# Patient Record
Sex: Male | Born: 1940
Health system: Southern US, Community
[De-identification: ages and names within clinical notes are randomized; demographics above are authoritative.]

## PROBLEM LIST (undated history)

## (undated) DIAGNOSIS — E785 Hyperlipidemia, unspecified: Secondary | ICD-10-CM

## (undated) DIAGNOSIS — I639 Cerebral infarction, unspecified: Secondary | ICD-10-CM

## (undated) DIAGNOSIS — I10 Essential (primary) hypertension: Secondary | ICD-10-CM

## (undated) DIAGNOSIS — R51 Headache: Secondary | ICD-10-CM

## (undated) DIAGNOSIS — R519 Headache, unspecified: Secondary | ICD-10-CM

## (undated) HISTORY — DX: Hyperlipidemia, unspecified: E78.5

## (undated) HISTORY — DX: Essential (primary) hypertension: I10

## (undated) HISTORY — DX: Headache, unspecified: R51.9

## (undated) HISTORY — DX: Cerebral infarction, unspecified: I63.9

## (undated) HISTORY — DX: Headache: R51

---

## 2017-07-28 ENCOUNTER — Other Ambulatory Visit: Payer: Self-pay | Admitting: Family Medicine

## 2017-07-28 ENCOUNTER — Encounter: Payer: Self-pay | Admitting: Family Medicine

## 2017-07-28 ENCOUNTER — Ambulatory Visit (INDEPENDENT_AMBULATORY_CARE_PROVIDER_SITE_OTHER): Payer: Medicare Other | Admitting: Family Medicine

## 2017-07-28 VITALS — BP 130/80 | HR 67 | Ht 68.0 in | Wt 176.0 lb

## 2017-07-28 DIAGNOSIS — Z131 Encounter for screening for diabetes mellitus: Secondary | ICD-10-CM | POA: Insufficient documentation

## 2017-07-28 DIAGNOSIS — Z91199 Patient's noncompliance with other medical treatment and regimen due to unspecified reason: Secondary | ICD-10-CM

## 2017-07-28 DIAGNOSIS — E7800 Pure hypercholesterolemia, unspecified: Secondary | ICD-10-CM

## 2017-07-28 DIAGNOSIS — I10 Essential (primary) hypertension: Secondary | ICD-10-CM | POA: Diagnosis not present

## 2017-07-28 DIAGNOSIS — F419 Anxiety disorder, unspecified: Secondary | ICD-10-CM

## 2017-07-28 DIAGNOSIS — Z9119 Patient's noncompliance with other medical treatment and regimen: Secondary | ICD-10-CM

## 2017-07-28 DIAGNOSIS — Z23 Encounter for immunization: Secondary | ICD-10-CM

## 2017-07-28 DIAGNOSIS — G44229 Chronic tension-type headache, not intractable: Secondary | ICD-10-CM

## 2017-07-28 DIAGNOSIS — E789 Disorder of lipoprotein metabolism, unspecified: Secondary | ICD-10-CM

## 2017-07-28 DIAGNOSIS — D352 Benign neoplasm of pituitary gland: Secondary | ICD-10-CM | POA: Diagnosis not present

## 2017-07-28 DIAGNOSIS — R51 Headache: Secondary | ICD-10-CM

## 2017-07-28 DIAGNOSIS — R519 Headache, unspecified: Secondary | ICD-10-CM | POA: Insufficient documentation

## 2017-07-28 DIAGNOSIS — Z1211 Encounter for screening for malignant neoplasm of colon: Secondary | ICD-10-CM

## 2017-07-28 MED ORDER — PAROXETINE HCL 10 MG PO TABS: 10.0000 mg | ORAL_TABLET | Freq: Every day | ORAL | 0 refills | Status: DC

## 2017-07-28 MED ORDER — PAROXETINE HCL 10 MG PO TABS: ORAL_TABLET | ORAL | 0 refills | Status: DC

## 2017-07-28 NOTE — Telephone Encounter (Signed)
Only meant for him to have a 30 day supply. I corrected this and sent a new rx to pharmacy.

## 2017-07-28 NOTE — Patient Instructions (Addendum)
Paroxetine tablets What is this medicine? PAROXETINE (pa ROX e teen) is used to treat depression. It may also be used to treat anxiety disorders, obsessive compulsive disorder, panic attacks, post traumatic stress, and premenstrual dysphoric disorder (PMDD). This medicine may be used for other purposes; ask your health care provider or pharmacist if you have questions. COMMON BRAND NAME(S): Paxil, Pexeva What should I tell my health care provider before I take this medicine? They need to know if you have any of these conditions: -bipolar disorder or a family history of bipolar disorder -bleeding disorders -glaucoma -heart disease -kidney disease -liver disease -low levels of sodium in the blood -seizures -suicidal thoughts, plans, or attempt; a previous suicide attempt by you or a family member -take MAOIs like Carbex, Eldepryl, Marplan, Nardil, and Parnate -take medicines that treat or prevent blood clots -thyroid disease -an unusual or allergic reaction to paroxetine, other medicines, foods, dyes, or preservatives -pregnant or trying to get pregnant -breast-feeding How should I use this medicine? Take this medicine by mouth with a glass of water. Follow the directions on the prescription label. You can take it with or without food. Take your medicine at regular intervals. Do not take your medicine more often than directed. Do not stop taking this medicine suddenly except upon the advice of your doctor. Stopping this medicine too quickly may cause serious side effects or your condition may worsen. A special MedGuide will be given to you by the pharmacist with each prescription and refill. Be sure to read this information carefully each time. Talk to your pediatrician regarding the use of this medicine in children. Special care may be needed. Overdosage: If you think you have taken too much of this medicine contact a poison control center or emergency room at once. NOTE: This medicine is  only for you. Do not share this medicine with others. What if I miss a dose? If you miss a dose, take it as soon as you can. If it is almost time for your next dose, take only that dose. Do not take double or extra doses. What may interact with this medicine? Do not take this medicine with any of the following medications: -linezolid -MAOIs like Carbex, Eldepryl, Marplan, Nardil, and Parnate -methylene blue (injected into a vein) -pimozide -thioridazine This medicine may also interact with the following medications: -alcohol -amphetamines -aspirin and aspirin-like medicines -atomoxetine -certain medicines for depression, anxiety, or psychotic disturbances -certain medicines for irregular heart beat like propafenone, flecainide, encainide, and quinidine -certain medicines for migraine headache like almotriptan, eletriptan, frovatriptan, naratriptan, rizatriptan, sumatriptan, zolmitriptan -cimetidine -digoxin -diuretics -fentanyl -fosamprenavir -furazolidone -isoniazid -lithium -medicines that treat or prevent blood clots like warfarin, enoxaparin, and dalteparin -medicines for sleep -NSAIDs, medicines for pain and inflammation, like ibuprofen or naproxen -phenobarbital -phenytoin -procarbazine -rasagiline -ritonavir -supplements like St. John's wort, kava kava, valerian -tamoxifen -tramadol -tryptophan This list may not describe all possible interactions. Give your health care provider a list of all the medicines, herbs, non-prescription drugs, or dietary supplements you use. Also tell them if you smoke, drink alcohol, or use illegal drugs. Some items may interact with your medicine. What should I watch for while using this medicine? Tell your doctor if your symptoms do not get better or if they get worse. Visit your doctor or health care professional for regular checks on your progress. Because it may take several weeks to see the full effects of this medicine, it is important  to continue your treatment as prescribed by your doctor.  Patients and their families should watch out for new or worsening thoughts of suicide or depression. Also watch out for sudden changes in feelings such as feeling anxious, agitated, panicky, irritable, hostile, aggressive, impulsive, severely restless, overly excited and hyperactive, or not being able to sleep. If this happens, especially at the beginning of treatment or after a change in dose, call your health care professional. You may get drowsy or dizzy. Do not drive, use machinery, or do anything that needs mental alertness until you know how this medicine affects you. Do not stand or sit up quickly, especially if you are an older patient. This reduces the risk of dizzy or fainting spells. Alcohol may interfere with the effect of this medicine. Avoid alcoholic drinks. Your mouth may get dry. Chewing sugarless gum or sucking hard candy, and drinking plenty of water will help. Contact your doctor if the problem does not go away or is severe. What side effects may I notice from receiving this medicine? Side effects that you should report to your doctor or health care professional as soon as possible: -allergic reactions like skin rash, itching or hives, swelling of the face, lips, or tongue -anxious -black, tarry stools -changes in vision -confusion -elevated mood, decreased need for sleep, racing thoughts, impulsive behavior -eye pain -fast, irregular heartbeat -feeling faint or lightheaded, falls -feeling agitated, angry, or irritable -hallucination, loss of contact with reality -loss of balance or coordination -loss of memory -painful or prolonged erections -restlessness, pacing, inability to keep still -seizures -stiff muscles -suicidal thoughts or other mood changes -trouble sleeping -unusual bleeding or bruising -unusually weak or tired -vomiting Side effects that usually do not require medical attention (report to your  doctor or health care professional if they continue or are bothersome): -change in appetite or weight -change in sex drive or performance -diarrhea -dizziness -dry mouth -increased sweating -indigestion, nausea -tired -tremors This list may not describe all possible side effects. Call your doctor for medical advice about side effects. You may report side effects to FDA at 1-800-FDA-1088. Where should I keep my medicine? Keep out of the reach of children. Store at room temperature between 15 and 30 degrees C (59 and 86 degrees F). Keep container tightly closed. Throw away any unused medicine after the expiration date. NOTE: This sheet is a summary. It may not cover all possible information. If you have questions about this medicine, talk to your doctor, pharmacist, or health care provider.  2018 Elsevier/Gold Standard (2015-11-25 15:50:32)  

## 2017-07-28 NOTE — Addendum Note (Signed)
Addended by: Jon Billings on: 07/28/2017 12:56 PM   Modules accepted: Orders

## 2017-07-28 NOTE — Progress Notes (Addendum)
Subjective:  Patient ID: Jose Vaughn, male    DOB: 1940/11/05  Age: 77 y.o. MRN: [id number]  CC: Establish Care   HPI Jose Vaughn presents for patient presents today with his wife for follow-up of his hypertension, elevated cholesterol, ASCVD, chronic headache, pituitary adenoma, nervousness, dizziness, establishment of care.  Patient patient reports that he is felt dizzy this.   he denies the sensation of movement.  He says that sometimes he feels lightheaded.  He has a long-standing history of generalized headache.  He says that these headaches tend to come and go on is on their own.  Headache has been stable and is nonprogressive.  He is compliant with his blood pressure medicines.  He has a long-standing history of what is believed to be of a stable pituitary macroadenoma.  MRI this past November confirm this.  He has a long-standing history of noncompliance with his medical therapy but he has wife assured me that  he has been taking his medicines.  Patient's wife said that he she thought that he had a colonoscopy 2 or 3 years ago and she is not certain what the follow-up was recommended. Last colonoscopy noted in the chart was back in 2010. He has had no changes inhis stooling patterns or rectal bleeding.   Outpatient Medications Prior to Visit  Medication Sig Dispense Refill  . amLODipine (NORVASC) 5 MG tablet Take 1 tablet by mouth daily.  4  . atorvastatin (LIPITOR) 80 MG tablet Take 1 tablet by mouth daily.  4  . metoprolol tartrate (LOPRESSOR) 25 MG tablet Take 1 tablet by mouth daily.  5  . Vitamin D, Ergocalciferol, (DRISDOL) 50000 units CAPS capsule Take 1 capsule by mouth once a week.  5   No facility-administered medications prior to visit.     ROS Review of Systems  Constitutional: Negative.   HENT: Negative.   Eyes: Negative.   Respiratory: Negative.   Cardiovascular: Negative.   Gastrointestinal: Negative.  Negative for abdominal distention, abdominal pain, anal  bleeding, blood in stool, constipation, diarrhea, nausea and vomiting.  Skin: Negative.   Neurological: Positive for light-headedness and headaches. Negative for dizziness, syncope, facial asymmetry, speech difficulty and weakness.  Hematological: Negative.   Psychiatric/Behavioral: Negative for dysphoric mood, self-injury, sleep disturbance and suicidal ideas. The patient is nervous/anxious.     Objective:  BP 130/80 (BP Location: Right Arm, Patient Position: Sitting, Cuff Size: Normal)   Pulse 67   Ht 5\' 8"  (1.727 m)   Wt 176 lb (79.8 kg)   SpO2 99%   BMI 26.76 kg/m   BP Readings from Last 3 Encounters:  07/28/17 130/80    Wt Readings from Last 3 Encounters:  07/28/17 176 lb (79.8 kg)   Depression screen PHQ 2/9 07/28/2017  Decreased Interest 2  Down, Depressed, Hopeless 0  PHQ - 2 Score 2  Altered sleeping 0  Tired, decreased energy 2  Change in appetite 0  Feeling bad or failure about yourself  0  Trouble concentrating 0  Moving slowly or fidgety/restless 0  Suicidal thoughts 0  PHQ-9 Score 4    Physical Exam  Constitutional: He is oriented to person, place, and time. He appears well-developed and well-nourished. No distress.  HENT:  Head: Normocephalic and atraumatic.  Right Ear: External ear normal.  Left Ear: External ear normal.  Mouth/Throat: No oropharyngeal exudate.  Eyes: Conjunctivae and EOM are normal. Pupils are equal, round, and reactive to light. Right eye exhibits no discharge. Left eye exhibits no  discharge. No scleral icterus.  Neck: Normal range of motion. Neck supple. No JVD present. No tracheal deviation present. No thyromegaly present.  Cardiovascular: Normal rate, regular rhythm and normal heart sounds.  Pulmonary/Chest: Effort normal and breath sounds normal. No stridor.  Abdominal: Bowel sounds are normal. He exhibits no distension. There is no tenderness. There is no rebound and no guarding.  Lymphadenopathy:    He has no cervical  adenopathy.  Neurological: He is alert and oriented to person, place, and time.  Skin: Skin is warm and dry. He is not diaphoretic.  Psychiatric: He has a normal mood and affect. His behavior is normal.    No results found for: WBC, HGB, HCT, PLT, GLUCOSE, CHOL, TRIG, HDL, LDLDIRECT, LDLCALC, ALT, AST, NA, K, CL, CREATININE, BUN, CO2, TSH, PSA, INR, GLUF, HGBA1C, MICROALBUR  Patient was never admitted.  Assessment & Plan:   Jose Vaughn was seen today for establish care.  Diagnoses and all orders for this visit:  Essential hypertension  Need for influenza vaccination -     Flu vaccine HIGH DOSE PF  Elevated serum cholesterol  Pituitary adenoma ([place]) -     Ambulatory referral to Neurology  Anxiety -     Discontinue: PARoxetine (PAXIL) 10 MG tablet; nightly -     PARoxetine (PAXIL) 10 MG tablet; Take 1 tablet (10 mg total) by mouth daily. At night  History of noncompliance with medical treatment  Screen for colon cancer -     Ambulatory referral to Gastroenterology  Chronic tension-type headache, not intractable -     Ambulatory referral to Neurology   I have discontinued Jose Vaughn's PARoxetine. I am also having him start on PARoxetine. Additionally, I am having him maintain his amLODipine, atorvastatin, Vitamin D (Ergocalciferol), and metoprolol tartrate.  I am requesting that neurology continue to follow him for his headache.  His wife says that he has medicine at the present time and chart review shows that he was just seen back in November.  Hopefully he is electing to take his cholesterol at this time and we will see improvement in his LDL cholesterol at the next visit.  Meds ordered this encounter  Medications  . DISCONTD: PARoxetine (PAXIL) 10 MG tablet    Sig: nightly    Dispense:  30 tablet    Refill:  0  . PARoxetine (PAXIL) 10 MG tablet    Sig: Take 1 tablet (10 mg total) by mouth daily. At night    Dispense:  30 tablet    Refill:  0     Follow-up: Return in  about 1 month (around 08/28/2017).  [name], MD

## 2017-07-31 ENCOUNTER — Telehealth: Payer: Self-pay | Admitting: Family Medicine

## 2017-07-31 NOTE — Telephone Encounter (Signed)
Copied from [id number]. Topic: Quick Communication - See Telephone Encounter >> Jul 31, 2017  1:33 PM Hewitt Shorts wrote: CRM for notification. See Telephone encounter for: pt is wanting to let Dr. Ethelene Hal know that his appt to the neurologist is not until 09/23/17 and he believes that Ethelene Hal wanted him to be seen before coming back to see Ethelene Hal and wants to know what to do next   Best number [phone]  07/31/17.

## 2017-07-31 NOTE — Telephone Encounter (Signed)
This will be the earliest patient can be seen. [facility] has a longer wait than March.

## 2017-07-31 NOTE — Telephone Encounter (Signed)
In this particular case, he can wait until March.

## 2017-08-01 NOTE — Telephone Encounter (Signed)
I tried to contact patient, voicemail is full. Per Dr. Ethelene Hal, it is okay for patient to wait until March for Neurology appt.

## 2017-08-04 NOTE — Telephone Encounter (Signed)
I tried to contact patient, the mailbox is full.

## 2017-08-05 NOTE — Telephone Encounter (Signed)
I called and spoke with patient. He will keep his appointment with Neurology in March.

## 2017-09-01 ENCOUNTER — Encounter: Payer: Self-pay | Admitting: Family Medicine

## 2017-09-23 ENCOUNTER — Encounter (INDEPENDENT_AMBULATORY_CARE_PROVIDER_SITE_OTHER): Payer: Self-pay

## 2017-09-23 ENCOUNTER — Encounter: Payer: Self-pay | Admitting: Neurology

## 2017-09-23 ENCOUNTER — Ambulatory Visit: Payer: Medicare Other | Admitting: Neurology

## 2017-09-23 VITALS — BP 142/78 | HR 64 | Ht 68.0 in | Wt 175.0 lb

## 2017-09-23 DIAGNOSIS — D352 Benign neoplasm of pituitary gland: Secondary | ICD-10-CM

## 2017-09-23 DIAGNOSIS — R519 Headache, unspecified: Secondary | ICD-10-CM

## 2017-09-23 DIAGNOSIS — R51 Headache: Secondary | ICD-10-CM | POA: Diagnosis not present

## 2017-09-23 MED ORDER — TRAMADOL HCL 50 MG PO TABS: 50.0000 mg | ORAL_TABLET | Freq: Four times a day (QID) | ORAL | 1 refills | Status: DC | PRN

## 2017-09-23 NOTE — Progress Notes (Signed)
PATIENT: Jose Vaughn DOB: [birth year]  Chief Complaint  Patient presents with  . Headache    He is here with his wife, Arbie Cookey.  Reports daily headaches for six months.  He takes Aleve 2-3 times per week for his more severe pain but it is only helpful some of the time.  He has previously been diagnosed with a pituitary adenoma.  [name] PCP    [name], MD     HISTORICAL  Jose Vaughn is a 77 year old male, seen in refer by his primary care physician Dr. Ethelene Hal, Mort[facility] Fries, for evaluation of frequent headaches, he is accompanied by his wife Arbie Cookey, initial evaluation was on September 23, 2017.  I reviewed his emergency room presentation on May 30, 2017, he was treated at the [facility] for worsening headaches, and confusion,  Had a repeat MRI of the brain with and without contrast in November 2018, chronic rounded intrasellar mass with suprasellar extension, compatible with pituitary macroadenoma measuring up to 22 mm, about 2 mm larger than in October 2017, regional mass-effect has not significantly change, the undersurface of the optic asthma, signal within the mass remains homogeneous, no evidence of interval hemorrhage, patchy left greater than right cerebral white matter T2/FLAIR hyperintensity lesions,  CT angiogram of the brain showed coronary atherosclerosis, CT angiogram of the neck showed fibrofatty plaque, with mild to moderate stenosis, right side is 30%, left side is 55%, vertebral artery was antegrade flow,  EEG was normal in Nov 2018.  Laboratory evaluations in March 2018, low potassium 3.6, otherwise normal CMP, CBC, hemoglobin of 12.9, ESR of 30, A1c of 5.8, C-reactive protein was elevated at 35.5, TSH 1.75  Around 2011, he presented with frequent headaches, there was also reported TIA episode, slurred speech, transient right, sometimes left side weakness, MRI of the brain at that time demonstrated pituitary macroadenoma, over the years, he was followed  by neurosurgeon Dr. Tonia Ghent and ophthalmologist Dr. Bing Plume, most recent neurosurgical evaluation was in December 2017, at that visit, there was mention of frequent headaches, interval enlargement, and upward displacement of optic chiasma, there was also elevated prolactin level 30.6 in 2017, but the patient was not treated with any medications, is worsening headache is due to cervicogenic,   Patient supposed to have his regular neurosurgeon and ophthalmology evaluation, but neurosurgeon left to the practice, he has lost follow-up for about 1 year,  Since summer 2018, he noticed worsening headaches, often starting from upper back, spreading forward to constant moderate to severe daily pressure headache, with mild blurry vision, but he denies significant visual field change, no nausea or vomiting, no lateralized motor or sensory deficit.  He has tried frequent Tylenol, NSAIDs with limited help  REVIEW OF SYSTEMS: Full 14 system review of systems performed and notable only for decreased energy, headache, dizziness, snoring, ringing the ears, weight loss  ALLERGIES: No Known Allergies  HOME MEDICATIONS: Current Outpatient Medications  Medication Sig Dispense Refill  . amLODipine (NORVASC) 5 MG tablet Take 1 tablet by mouth daily.  4  . atorvastatin (LIPITOR) 80 MG tablet Take 1 tablet by mouth daily.  4  . metoprolol tartrate (LOPRESSOR) 25 MG tablet Take 1 tablet by mouth daily.  5  . PARoxetine (PAXIL) 10 MG tablet Take 1 tablet (10 mg total) by mouth daily. At night 30 tablet 0  . Vitamin D, Ergocalciferol, (DRISDOL) 50000 units CAPS capsule Take 1 capsule by mouth once a week.  5   No current facility-administered medications for this  visit.     PAST MEDICAL HISTORY: Past Medical History:  Diagnosis Date  . Frequent headaches   . Hyperlipidemia   . Hypertension   . Stroke [facility])     PAST SURGICAL HISTORY: History reviewed. No pertinent surgical history.  FAMILY HISTORY: Family  History  Problem Relation Age of Onset  . Heart attack Mother   . Other Father        unsure  . Ovarian cancer Sister     SOCIAL HISTORY:  Social History   Socioeconomic History  . Marital status: Married    Spouse name: Not on file  . Number of children: 3  . Years of education: 10th grade  . Highest education level: Not on file  Social Needs  . Financial resource strain: Not on file  . Food insecurity - worry: Not on file  . Food insecurity - inability: Not on file  . Transportation needs - medical: Not on file  . Transportation needs - non-medical: Not on file  Occupational History  . Occupation: Retired  Tobacco Use  . Smoking status: Former Smoker    Types: Cigarettes  . Smokeless tobacco: Never Used  . Tobacco comment: 09/23/17 - quit 40+ years ago  Substance and Sexual Activity  . Alcohol use: No    Frequency: Never  . Drug use: No  . Sexual activity: Not on file  Other Topics Concern  . Not on file  Social History Narrative   Lives at home with his wife.   1 cup caffeine per day.   Left-handed.     PHYSICAL EXAM   Vitals:   09/23/17 1110  BP: (!) 142/78  Pulse: 64  Weight: 175 lb (79.4 kg)  Height: 5' 8"  (1.727 m)    Not recorded      Body mass index is 26.61 kg/m.  PHYSICAL EXAMNIATION:  Gen: NAD, conversant, well nourised, obese, well groomed                     Cardiovascular: Regular rate rhythm, no peripheral edema, warm, nontender. Eyes: Conjunctivae clear without exudates or hemorrhage Neck: Supple, no carotid bruits. Pulmonary: Clear to auscultation bilaterally   NEUROLOGICAL EXAM:  MENTAL STATUS: Speech:    Speech is normal; fluent and spontaneous with normal comprehension.  Cognition:     Orientation to time, place and person     Normal recent and remote memory     Normal Attention span and concentration     Normal Language, naming, repeating,spontaneous speech     Fund of knowledge   CRANIAL NERVES: CN II: Visual  fields are full to confrontation. Fundoscopic exam is normal with sharp discs and no vascular changes. Pupils are round equal and briskly reactive to light. CN III, IV, VI: extraocular movement are normal. No ptosis. CN V: Facial sensation is intact to pinprick in all 3 divisions bilaterally. Corneal responses are intact.  CN VII: Face is symmetric with normal eye closure and smile. CN VIII: Hearing is normal to rubbing fingers CN IX, X: Palate elevates symmetrically. Phonation is normal. CN XI: Head turning and shoulder shrug are intact CN XII: Tongue is midline with normal movements and no atrophy.  MOTOR: There is no pronator drift of out-stretched arms. Muscle bulk and tone are normal. Muscle strength is normal.  REFLEXES: Reflexes are 2+ and symmetric at the biceps, triceps, knees, and ankles. Plantar responses are flexor.  SENSORY: Intact to light touch, pinprick, positional sensation and vibratory sensation are  intact in fingers and toes.  COORDINATION: Rapid alternating movements and fine finger movements are intact. There is no dysmetria on finger-to-nose and heel-knee-shin.    GAIT/STANCE: Posture is normal. Gait is steady with normal steps, base, arm swing, and turning. Heel and toe walking are normal. Tandem gait is normal.  Romberg is absent.   DIAGNOSTIC DATA (LABS, IMAGING, TESTING) - I reviewed patient records, labs, notes, testing and imaging myself where available.   ASSESSMENT AND PLAN  Clarnce Homan is a 77 y.o. male    Chronic headache, Known history of pituitary macroadenoma,  Her recent scan on May 30, 2017, there is mild enlargement,  Previous elevated prolactin level  Repeat hormone level, refer him to neurosurgeon  Also advised him continue follow-up with his ophthalmologist Dr. Bing Plume for formal visual field testing  Suboptimal response to Tylenol for his constant headache, which likely related to enlarging pituitary macroadenoma  Tramadol as  needed  Marcial Pacas, M.D. Ph.D.  [facility] [address], [place], [place] [address] Ph: [phone] Fax: [phone]  CC: Referring Provider

## 2017-09-24 LAB — ACTH: ACTH: 26 pg/mL (ref 7.2–63.3)

## 2017-09-24 LAB — INSULIN-LIKE GROWTH FACTOR: INSULIN LIKE GF 1: 59 ng/mL (ref 41–179)

## 2017-09-24 LAB — PROLACTIN: Prolactin: 34.3 ng/mL — ABNORMAL HIGH (ref 4.0–15.2)

## 2017-09-25 ENCOUNTER — Telehealth: Payer: Self-pay | Admitting: *Deleted

## 2017-09-25 NOTE — Telephone Encounter (Signed)
Spoke to patient - he is aware of results and verbalized understanding to proceed with the discussed plan from his office visit.

## 2017-09-25 NOTE — Telephone Encounter (Signed)
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

## 2017-11-10 ENCOUNTER — Telehealth: Payer: Self-pay | Admitting: Neurology

## 2017-11-10 NOTE — Telephone Encounter (Signed)
Patient was seen by neurosurgeon Dr. Kathyrn Sheriff on October 15, 2017,  Patient had progressive enlarged pituitary macroadenoma, prolactin secreting, worsening headaches, but he also has known history of cerebrovascular disease, significant stenosis of supraclinoid internal carotid artery, worsening on the right side,  This case will be discussed in the multidisciplinary neuro-oncology conference, will reevaluate after formal visual field testing

## 2017-12-29 ENCOUNTER — Telehealth: Payer: Self-pay | Admitting: Neurology

## 2017-12-29 ENCOUNTER — Encounter: Payer: Self-pay | Admitting: Neurology

## 2017-12-29 ENCOUNTER — Ambulatory Visit: Payer: Medicare Other | Admitting: Neurology

## 2017-12-29 VITALS — BP 128/64 | HR 60 | Ht 68.0 in | Wt 178.0 lb

## 2017-12-29 DIAGNOSIS — D352 Benign neoplasm of pituitary gland: Secondary | ICD-10-CM

## 2017-12-29 DIAGNOSIS — I6523 Occlusion and stenosis of bilateral carotid arteries: Secondary | ICD-10-CM | POA: Diagnosis not present

## 2017-12-29 DIAGNOSIS — I771 Stricture of artery: Secondary | ICD-10-CM | POA: Diagnosis not present

## 2017-12-29 MED ORDER — TOPIRAMATE 100 MG PO TABS: 100.0000 mg | ORAL_TABLET | Freq: Two times a day (BID) | ORAL | 11 refills | Status: DC

## 2017-12-29 MED ORDER — BUTALBITAL-APAP-CAFFEINE 50-325-40 MG PO TABS: 1.0000 | ORAL_TABLET | Freq: Four times a day (QID) | ORAL | 5 refills | Status: DC | PRN

## 2017-12-29 NOTE — Progress Notes (Signed)
PATIENT: Jose Vaughn DOB: July 28, 1940  Chief Complaint  Patient presents with  . Pituitary Adenoma    He is here with his wife, Arbie Cookey.  He is still getting frequent headaches. States Tramadol is not helpful for his pain.     HISTORICAL  Jose Vaughn is a 77 year old male, seen in refer by his primary care physician Dr. Ethelene Hal, [facility], for evaluation of frequent headaches, he is accompanied by his wife Arbie Cookey, initial evaluation was on September 23, 2017.  I reviewed his emergency room presentation on May 30, 2017, he was treated at the [facility] for worsening headaches, and confusion,  Had a repeat MRI of the brain with and without contrast in November 2018, chronic rounded intrasellar mass with suprasellar extension, compatible with pituitary macroadenoma measuring up to 22 mm, about 2 mm larger than in October 2017, regional mass-effect has not significantly change, the undersurface of the optic asthma, signal within the mass remains homogeneous, no evidence of interval hemorrhage, patchy left greater than right cerebral white matter T2/FLAIR hyperintensity lesions,  CT angiogram of the brain showed coronary atherosclerosis, CT angiogram of the neck showed fibrofatty plaque, with mild to moderate stenosis, right side is 30%, left side is 55%, vertebral artery was antegrade flow,  EEG was normal in Nov 2018.  Laboratory evaluations in March 2018, low potassium 3.6, otherwise normal CMP, CBC, hemoglobin of 12.9, ESR of 30, A1c of 5.8, C-reactive protein was elevated at 35.5, TSH 1.75  Around 2011, he presented with frequent headaches, there was also reported TIA episode, slurred speech, transient right, sometimes left side weakness, MRI of the brain at that time demonstrated pituitary macroadenoma, over the years, he was followed by neurosurgeon Dr. Tonia Ghent and ophthalmologist Dr. Bing Plume, most recent neurosurgical evaluation was in December 2017, at that visit, there was  mention of frequent headaches, interval enlargement, and upward displacement of optic chiasma, there was also elevated prolactin level 30.6 in 2017, but the patient was not treated with any medications, is worsening headache is due to cervicogenic,   Patient supposed to have his regular neurosurgeon and ophthalmology evaluation, but neurosurgeon left to the practice, he has lost follow-up for about 1 year,  Since summer 2018, he noticed worsening headaches, often starting from upper back, spreading forward to constant moderate to severe daily pressure headache, with mild blurry vision, but he denies significant visual field change, no nausea or vomiting, no lateralized motor or sensory deficit.  He has tried frequent Tylenol, NSAIDs with limited help  UPDATE December 29 2017: His is vision is about same,   I reviewed office note by Dr. Bing Plume on 08/26/2017, there is questionable reliability on visual field testing of both eye, supratemporal depression of both eye, similar appearance to 2017 Visual [place].,  He complains of headadache, 5 out of 10, present 50% of the day, sometimes go up to 10 out of 10, he rely on his wife provide the history, his headache starting from cervical region, spreading to the vertex, and along his spine, He was seen by Dr. Kathyrn Sheriff recently, there was a concern of peri-surgical risk because of his history of significant intracranial atherosclerotic disease,  CTA in Nov 2018: Severe atherosclerosis with chronic bilateral MCA occlusion. Some right MCA branches are visible due to better developed collaterals on the right as seen on 2017 comparison. There is essentially no left MCA branch visualization today. Advanced atherosclerosis in the carotid siphons with bilateral high-grade narrowing. No flow limiting stenosis or occlusion seen in  the posterior circulation. Pituitary adenoma with prominent mass effect on the optic chiasm. The adenoma has progressively enlarged since initial  visualization in 2005.  For his headache, he has tried tramadol without improvement,  Laboratory evaluation seen March 2019 showed elevated prolactin level 34.3, was normal TSH, ACTH, insulin like growth factor,   REVIEW OF SYSTEMS: Full 14 system review of systems performed and notable only for decreased energy, headache, dizziness, snoring, ringing the ears, weight loss  ALLERGIES: No Known Allergies  HOME MEDICATIONS: Current Outpatient Medications  Medication Sig Dispense Refill  . amLODipine (NORVASC) 5 MG tablet Take 1 tablet by mouth daily.  4  . atorvastatin (LIPITOR) 80 MG tablet Take 1 tablet by mouth daily.  4  . metoprolol tartrate (LOPRESSOR) 25 MG tablet Take 1 tablet by mouth daily.  5  . PARoxetine (PAXIL) 10 MG tablet Take 1 tablet (10 mg total) by mouth daily. At night 30 tablet 0  . traMADol (ULTRAM) 50 MG tablet Take 1 tablet (50 mg total) by mouth every 6 (six) hours as needed. 30 tablet 1  . Vitamin D, Ergocalciferol, (DRISDOL) 50000 units CAPS capsule Take 1 capsule by mouth once a week.  5   No current facility-administered medications for this visit.     PAST MEDICAL HISTORY: Past Medical History:  Diagnosis Date  . Frequent headaches   . Hyperlipidemia   . Hypertension   . Stroke [facility])     PAST SURGICAL HISTORY: History reviewed. No pertinent surgical history.  FAMILY HISTORY: Family History  Problem Relation Age of Onset  . Heart attack Mother   . Other Father        unsure  . Ovarian cancer Sister     SOCIAL HISTORY:  Social History   Socioeconomic History  . Marital status: Married    Spouse name: Not on file  . Number of children: 3  . Years of education: 10th grade  . Highest education level: Not on file  Occupational History  . Occupation: Retired  Scientific laboratory technician  . Financial resource strain: Not on file  . Food insecurity:    Worry: Not on file    Inability: Not on file  . Transportation needs:    Medical: Not on file     Non-medical: Not on file  Tobacco Use  . Smoking status: Former Smoker    Types: Cigarettes  . Smokeless tobacco: Never Used  . Tobacco comment: 09/23/17 - quit 40+ years ago  Substance and Sexual Activity  . Alcohol use: No    Frequency: Never  . Drug use: No  . Sexual activity: Not on file  Lifestyle  . Physical activity:    Days per week: Not on file    Minutes per session: Not on file  . Stress: Not on file  Relationships  . Social connections:    Talks on phone: Not on file    Gets together: Not on file    Attends religious service: Not on file    Active member of club or organization: Not on file    Attends meetings of clubs or organizations: Not on file    Relationship status: Not on file  . Intimate partner violence:    Fear of current or ex partner: Not on file    Emotionally abused: Not on file    Physically abused: Not on file    Forced sexual activity: Not on file  Other Topics Concern  . Not on file  Social History Narrative  Lives at home with his wife.   1 cup caffeine per day.   Left-handed.     PHYSICAL EXAM   Vitals:   12/29/17 1304  BP: 128/64  Pulse: 60  Weight: 178 lb (80.7 kg)  Height: 5' 8"  (1.727 m)    Not recorded      Body mass index is 27.06 kg/m.  PHYSICAL EXAMNIATION:  Gen: NAD, conversant, well nourised, obese, well groomed                     Cardiovascular: Regular rate rhythm, no peripheral edema, warm, nontender. Eyes: Conjunctivae clear without exudates or hemorrhage Neck: Supple, no carotid bruits. Pulmonary: Clear to auscultation bilaterally   NEUROLOGICAL EXAM:  MENTAL STATUS: Speech:    Speech is normal; fluent and spontaneous with normal comprehension.  Cognition:     Orientation to time, place and person     Normal recent and remote memory     Normal Attention span and concentration     Normal Language, naming, repeating,spontaneous speech     Fund of knowledge   CRANIAL NERVES: CN II: Visual fields  are full to confrontation. Fundoscopic exam is normal with sharp discs and no vascular changes. Pupils are round equal and briskly reactive to light. CN III, IV, VI: extraocular movement are normal. No ptosis. CN V: Facial sensation is intact to pinprick in all 3 divisions bilaterally. Corneal responses are intact.  CN VII: Face is symmetric with normal eye closure and smile. CN VIII: Hearing is normal to rubbing fingers CN IX, X: Palate elevates symmetrically. Phonation is normal. CN XI: Head turning and shoulder shrug are intact CN XII: Tongue is midline with normal movements and no atrophy.  MOTOR: There is no pronator drift of out-stretched arms. Muscle bulk and tone are normal. Muscle strength is normal.  REFLEXES: Reflexes are 2+ and symmetric at the biceps, triceps, knees, and ankles. Plantar responses are flexor.  SENSORY: Intact to light touch, pinprick, positional sensation and vibratory sensation are intact in fingers and toes.  COORDINATION: Rapid alternating movements and fine finger movements are intact. There is no dysmetria on finger-to-nose and heel-knee-shin.    GAIT/STANCE: Posture is normal. Gait is steady with normal steps,    DIAGNOSTIC DATA (LABS, IMAGING, TESTING) - I reviewed patient records, labs, notes, testing and imaging myself where available.   ASSESSMENT AND PLAN  Jose Vaughn is a 77 y.o. male    Chronic headache, Known history of pituitary macroadenoma,  Her recent scan on May 30, 2017, there is mild enlargement, prolactin secreting adenocarcinoma  He was seen by neurosurgeon Dr. Kathyrn Sheriff, not a good surgical candidate due to his severe intracranial atherosclerotic disease,  Recent visit with his ophthalmologist Dr. Bing Plume for formal visual field testing showed bilateral supratemporal visual field depression,  Persistent headaches likely related to his pituitary macroadenoma, I have suggested Botox injection as headache prevention, he wants  to hold off, will try Topamax 100 mg twice daily as preventive medications.  Marcial Pacas, M.D. Ph.D.  [facility] [address] [place], [place] [address] Ph: [phone] Fax: [phone]  CC: Referring Provider

## 2017-12-29 NOTE — Telephone Encounter (Signed)
[employer] Medicare order sent to GI. They will reach out to the pt to schedule.  °

## 2017-12-30 ENCOUNTER — Telehealth: Payer: Self-pay | Admitting: *Deleted

## 2017-12-30 LAB — CBC WITH DIFFERENTIAL/PLATELET
BASOS: 1 %
Basophils Absolute: 0 10*3/uL (ref 0.0–0.2)
EOS (ABSOLUTE): 0.4 10*3/uL (ref 0.0–0.4)
EOS: 5 %
HEMATOCRIT: 34.8 % — AB (ref 37.5–51.0)
HEMOGLOBIN: 11.6 g/dL — AB (ref 13.0–17.7)
IMMATURE GRANS (ABS): 0 10*3/uL (ref 0.0–0.1)
IMMATURE GRANULOCYTES: 0 %
LYMPHS: 33 %
Lymphocytes Absolute: 2.4 10*3/uL (ref 0.7–3.1)
MCH: 29.9 pg (ref 26.6–33.0)
MCHC: 33.3 g/dL (ref 31.5–35.7)
MCV: 90 fL (ref 79–97)
MONOCYTES: 8 %
Monocytes Absolute: 0.6 10*3/uL (ref 0.1–0.9)
NEUTROS ABS: 4 10*3/uL (ref 1.4–7.0)
NEUTROS PCT: 53 %
Platelets: 200 10*3/uL (ref 150–450)
RBC: 3.88 x10E6/uL — ABNORMAL LOW (ref 4.14–5.80)
RDW: 14 % (ref 12.3–15.4)
WBC: 7.4 10*3/uL (ref 3.4–10.8)

## 2017-12-30 LAB — SEDIMENTATION RATE: Sed Rate: 13 mm/hr (ref 0–30)

## 2017-12-30 LAB — COMPREHENSIVE METABOLIC PANEL
ALK PHOS: 81 IU/L (ref 39–117)
ALT: 20 IU/L (ref 0–44)
AST: 27 IU/L (ref 0–40)
Albumin/Globulin Ratio: 1.7 (ref 1.2–2.2)
Albumin: 4 g/dL (ref 3.5–4.8)
BILIRUBIN TOTAL: 0.5 mg/dL (ref 0.0–1.2)
BUN / CREAT RATIO: 16 (ref 10–24)
BUN: 20 mg/dL (ref 8–27)
CALCIUM: 8.6 mg/dL (ref 8.6–10.2)
CHLORIDE: 105 mmol/L (ref 96–106)
CO2: 24 mmol/L (ref 20–29)
CREATININE: 1.27 mg/dL (ref 0.76–1.27)
GFR calc Af Amer: 63 mL/min/{1.73_m2} (ref >59)
GFR calc non Af Amer: 55 mL/min/{1.73_m2} — ABNORMAL LOW (ref >59)
GLOBULIN, TOTAL: 2.4 g/dL (ref 1.5–4.5)
Glucose: 91 mg/dL (ref 65–99)
Potassium: 4.3 mmol/L (ref 3.5–5.2)
SODIUM: 143 mmol/L (ref 134–144)
TOTAL PROTEIN: 6.4 g/dL (ref 6.0–8.5)

## 2017-12-30 LAB — TSH: TSH: 1.76 u[IU]/mL (ref 0.450–4.500)

## 2017-12-30 LAB — C-REACTIVE PROTEIN: CRP: 3 mg/L (ref 0–10)

## 2017-12-30 NOTE — Telephone Encounter (Signed)
-----   Message from Marcial Pacas, MD sent at 12/30/2017  8:12 AM EDT ----- Please call patient: Laboratory evaluation showed mild anemia, Hg of 11.6,  he may continue follow-up with his care physician for his mild anemia, rest of the laboratory evaluation showed no significant abnormality

## 2017-12-30 NOTE — Telephone Encounter (Signed)
Spoke to his wife on [place] - she is aware of results and verbalized understanding.

## 2018-01-02 ENCOUNTER — Telehealth: Payer: Self-pay | Admitting: Neurology

## 2018-01-02 NOTE — Telephone Encounter (Signed)
Please call patient and [facility] image to cancel his scheduled Angiogram on July 10th, I was able to find out his CT angiogram report from [facility] in Nov 2018:  IMPRESSION: 1. Severe atherosclerosis with chronic bilateral MCA occlusion. Some right MCA branches are visible due to better developed collaterals on the right as seen on 2017 comparison. There is essentially no left MCA branch visualization today. 2. Advanced atherosclerosis in the carotid siphons with bilateral high-grade narrowing. 3. No flow limiting stenosis or occlusion seen in the posterior circulation. 4. Pituitary adenoma with prominent mass effect on the optic chiasm. The adenoma has progressively enlarged since initial visualization in 2005.

## 2018-01-14 ENCOUNTER — Other Ambulatory Visit: Payer: Medicare Other

## 2018-04-01 ENCOUNTER — Encounter: Payer: Self-pay | Admitting: Family Medicine

## 2018-04-01 ENCOUNTER — Ambulatory Visit: Payer: Medicare Other | Admitting: Family Medicine

## 2018-04-01 VITALS — BP 132/80 | HR 69 | Ht 68.0 in | Wt 177.5 lb

## 2018-04-01 DIAGNOSIS — I6523 Occlusion and stenosis of bilateral carotid arteries: Secondary | ICD-10-CM | POA: Diagnosis not present

## 2018-04-01 DIAGNOSIS — I10 Essential (primary) hypertension: Secondary | ICD-10-CM

## 2018-04-01 DIAGNOSIS — D649 Anemia, unspecified: Secondary | ICD-10-CM

## 2018-04-01 LAB — CBC
HEMATOCRIT: 39.5 % (ref 39.0–52.0)
Hemoglobin: 13.3 g/dL (ref 13.0–17.0)
MCHC: 33.6 g/dL (ref 30.0–36.0)
MCV: 89 fl (ref 78.0–100.0)
Platelets: 171 10*3/uL (ref 150.0–400.0)
RBC: 4.44 Mil/uL (ref 4.22–5.81)
RDW: 13.5 % (ref 11.5–15.5)
WBC: 7.4 10*3/uL (ref 4.0–10.5)

## 2018-04-01 LAB — URINALYSIS, ROUTINE W REFLEX MICROSCOPIC
BILIRUBIN URINE: NEGATIVE
KETONES UR: NEGATIVE
LEUKOCYTES UA: NEGATIVE
Nitrite: NEGATIVE
PH: 5.5 (ref 5.0–8.0)
SPECIFIC GRAVITY, URINE: 1.025 (ref 1.000–1.030)
Total Protein, Urine: NEGATIVE
URINE GLUCOSE: NEGATIVE
UROBILINOGEN UA: 0.2 (ref 0.0–1.0)

## 2018-04-01 LAB — COMPREHENSIVE METABOLIC PANEL
ALBUMIN: 4.2 g/dL (ref 3.5–5.2)
ALK PHOS: 93 U/L (ref 39–117)
ALT: 16 U/L (ref 0–53)
AST: 25 U/L (ref 0–37)
BILIRUBIN TOTAL: 0.9 mg/dL (ref 0.2–1.2)
BUN: 21 mg/dL (ref 6–23)
CO2: 30 mEq/L (ref 19–32)
CREATININE: 1.13 mg/dL (ref 0.40–1.50)
Calcium: 9.1 mg/dL (ref 8.4–10.5)
Chloride: 104 mEq/L (ref 96–112)
GFR: 81.07 mL/min (ref >60.00)
GLUCOSE: 82 mg/dL (ref 70–99)
POTASSIUM: 3.7 meq/L (ref 3.5–5.1)
SODIUM: 141 meq/L (ref 135–145)
Total Protein: 7.5 g/dL (ref 6.0–8.3)

## 2018-04-01 LAB — LIPID PANEL
CHOLESTEROL: 149 mg/dL (ref 0–200)
HDL: 37.9 mg/dL — ABNORMAL LOW (ref >39.00)
LDL Cholesterol: 97 mg/dL (ref 0–99)
NONHDL: 110.64
Total CHOL/HDL Ratio: 4
Triglycerides: 69 mg/dL (ref 0.0–149.0)
VLDL: 13.8 mg/dL (ref 0.0–40.0)

## 2018-04-01 LAB — B12 AND FOLATE PANEL
FOLATE: 14.4 ng/mL (ref >5.9)
VITAMIN B 12: 313 pg/mL (ref 211–911)

## 2018-04-01 MED ORDER — METOPROLOL SUCCINATE ER 25 MG PO TB24: 25.0000 mg | ORAL_TABLET | Freq: Every day | ORAL | 3 refills | Status: DC

## 2018-04-01 MED ORDER — AMLODIPINE BESYLATE 5 MG PO TABS: 5.0000 mg | ORAL_TABLET | Freq: Every day | ORAL | 1 refills | Status: DC

## 2018-04-01 NOTE — Progress Notes (Signed)
Subjective:  Patient ID: Jose Vaughn, male    DOB: [birth year]  Age: 77 y.o. MRN: [id number]  CC: frequent headaches   HPI Jose Vaughn presents for follow-up of his hypertension and elevated cholesterol.  He continues to take the Norvasc metoprolol tartrate daily and high-dose Lipitor.  He is having no issues with these medicines.  He is having lightheadedness with his Topamax.  And he discontinued the medicine himself.  Headaches have returned.  He is accompanied by his wife today.  Review of the chart shows a mild anemia noted and a CBC done by his neurologist.  Neurologist had recommended follow-up with me.  Patient denies hematuria or blood in his stool.  There is no melena or bright red bleeding per rectum.  Outpatient Medications Prior to Visit  Medication Sig Dispense Refill  . atorvastatin (LIPITOR) 80 MG tablet Take 1 tablet by mouth daily.  4  . butalbital-acetaminophen-caffeine (FIORICET, ESGIC) 50-325-40 MG tablet Take 1 tablet by mouth every 6 (six) hours as needed for headache. Do not refill in less than 30 days 12 tablet 5  . PARoxetine (PAXIL) 10 MG tablet Take 1 tablet (10 mg total) by mouth daily. At night 30 tablet 0  . traMADol (ULTRAM) 50 MG tablet Take 1 tablet (50 mg total) by mouth every 6 (six) hours as needed. 30 tablet 1  . Vitamin D, Ergocalciferol, (DRISDOL) 50000 units CAPS capsule Take 1 capsule by mouth once a week.  5  . amLODipine (NORVASC) 5 MG tablet Take 1 tablet by mouth daily.  4  . metoprolol tartrate (LOPRESSOR) 25 MG tablet Take 1 tablet by mouth daily.  5  . topiramate (TOPAMAX) 100 MG tablet Take 1 tablet (100 mg total) by mouth 2 (two) times daily. 60 tablet 11   No facility-administered medications prior to visit.     ROS Review of Systems  Constitutional: Negative.   HENT: Negative.   Eyes: Negative for photophobia and visual disturbance.  Respiratory: Negative.   Cardiovascular: Negative.   Gastrointestinal: Negative for anal bleeding  and blood in stool.  Genitourinary: Negative for hematuria.  Musculoskeletal: Negative.   Allergic/Immunologic: Negative for immunocompromised state.  Neurological: Positive for headaches.  Hematological: Does not bruise/bleed easily.  Psychiatric/Behavioral: Negative.     Objective:  BP 132/80   Pulse 69   Ht 5\' 8"  (1.727 m)   Wt 177 lb 8 oz (80.5 kg)   SpO2 97%   BMI 26.99 kg/m   BP Readings from Last 3 Encounters:  04/01/18 132/80  12/29/17 128/64  09/23/17 (!) 142/78    Wt Readings from Last 3 Encounters:  04/01/18 177 lb 8 oz (80.5 kg)  12/29/17 178 lb (80.7 kg)  09/23/17 175 lb (79.4 kg)    Physical Exam  Constitutional: He is oriented to person, place, and time. He appears well-developed and well-nourished. No distress.  HENT:  Head: Normocephalic and atraumatic.  Right Ear: External ear normal.  Left Ear: External ear normal.  Mouth/Throat: Oropharynx is clear and moist. No oropharyngeal exudate.  Eyes: Pupils are equal, round, and reactive to light. Conjunctivae are normal. Right eye exhibits no discharge. Left eye exhibits no discharge.  Neck: No JVD present. No tracheal deviation present. No thyromegaly present.  Cardiovascular: Normal rate, regular rhythm and normal heart sounds.  Pulmonary/Chest: Effort normal and breath sounds normal.  Neurological: He is alert and oriented to person, place, and time.  Skin: Skin is warm and dry. He is not diaphoretic.  Psychiatric: He  has a normal mood and affect. His behavior is normal.    Lab Results  Component Value Date   WBC 7.4 12/29/2017   HGB 11.6 (L) 12/29/2017   HCT 34.8 (L) 12/29/2017   PLT 200 12/29/2017   GLUCOSE 91 12/29/2017   ALT 20 12/29/2017   AST 27 12/29/2017   NA 143 12/29/2017   K 4.3 12/29/2017   CL 105 12/29/2017   CREATININE 1.27 12/29/2017   BUN 20 12/29/2017   CO2 24 12/29/2017   TSH 1.760 12/29/2017    Patient was never admitted.  Assessment & Plan:   Jose Vaughn was seen today  for frequent headaches.  Diagnoses and all orders for this visit:  Essential hypertension -     CBC -     Comprehensive metabolic panel -     Urinalysis, Routine w reflex microscopic -     amLODipine (NORVASC) 5 MG tablet; Take 1 tablet (5 mg total) by mouth daily. -     metoprolol succinate (TOPROL-XL) 25 MG 24 hr tablet; Take 1 tablet (25 mg total) by mouth daily.  Atherosclerosis of both carotid arteries -     CBC -     Comprehensive metabolic panel -     Lipid panel  Anemia, unspecified type -     CBC -     Iron, TIBC and Ferritin Panel -     B12 and Folate Panel -     Urinalysis, Routine w reflex microscopic   I have discontinued Jose Vaughn's metoprolol tartrate and topiramate. I have also changed his amLODipine. Additionally, I am having him start on metoprolol succinate. Lastly, I am having him maintain his atorvastatin, Vitamin D (Ergocalciferol), PARoxetine, traMADol, and butalbital-acetaminophen-caffeine.  Meds ordered this encounter  Medications  . amLODipine (NORVASC) 5 MG tablet    Sig: Take 1 tablet (5 mg total) by mouth daily.    Dispense:  100 tablet    Refill:  1  . metoprolol succinate (TOPROL-XL) 25 MG 24 hr tablet    Sig: Take 1 tablet (25 mg total) by mouth daily.    Dispense:  90 tablet    Refill:  3   Patient will follow-up with neurology for further treatment of his headaches.  Discussed possible etiologies of his anemia.  And checking B12 folate and iron levels today.  He will follow-up in 1 month.  Follow-up: Return in about 1 month (around 05/01/2018).  [name], MD

## 2018-04-02 ENCOUNTER — Other Ambulatory Visit: Payer: Self-pay

## 2018-04-02 DIAGNOSIS — R319 Hematuria, unspecified: Secondary | ICD-10-CM

## 2018-04-02 LAB — IRON,TIBC AND FERRITIN PANEL
%SAT: 33 % (calc) (ref 20–48)
Ferritin: 389 ng/mL — ABNORMAL HIGH (ref 24–380)
Iron: 68 ug/dL (ref 50–180)
TIBC: 206 ug/dL — AB (ref 250–425)

## 2018-06-22 ENCOUNTER — Encounter: Payer: Self-pay | Admitting: Neurology

## 2018-06-22 ENCOUNTER — Ambulatory Visit: Payer: Medicare Other | Admitting: Neurology

## 2018-06-22 VITALS — BP 136/75 | HR 62 | Ht 68.0 in

## 2018-06-22 DIAGNOSIS — R51 Headache: Secondary | ICD-10-CM

## 2018-06-22 DIAGNOSIS — R519 Headache, unspecified: Secondary | ICD-10-CM

## 2018-06-22 MED ORDER — DIVALPROEX SODIUM ER 250 MG PO TB24: 250.0000 mg | ORAL_TABLET | Freq: Every day | ORAL | 11 refills | Status: DC

## 2018-06-22 NOTE — Progress Notes (Signed)
PATIENT: Jose Vaughn DOB: [birth year]  Chief Complaint  Patient presents with  . Follow-up    6 month follow up. Wife present. New room. Patient stated that the Topiramate made him sick.      HISTORICAL  Jose Vaughn is a 77 year old male, seen in refer by his primary care physician Dr. Ethelene Hal, [facility], for evaluation of frequent headaches, he is accompanied by his wife Arbie Cookey, initial evaluation was on September 23, 2017.  I reviewed his emergency room presentation on May 30, 2017, he was treated at the [facility] for worsening headaches, and confusion,  Had a repeat MRI of the brain with and without contrast in November 2018, chronic rounded intrasellar mass with suprasellar extension, compatible with pituitary macroadenoma measuring up to 22 mm, about 2 mm larger than in October 2017, regional mass-effect has not significantly change, the undersurface of the optic asthma, signal within the mass remains homogeneous, no evidence of interval hemorrhage, patchy left greater than right cerebral white matter T2/FLAIR hyperintensity lesions,  CT angiogram of the brain showed coronary atherosclerosis, CT angiogram of the neck showed fibrofatty plaque, with mild to moderate stenosis, right side is 30%, left side is 55%, vertebral artery was antegrade flow,  EEG was normal in Nov 2018.  Laboratory evaluations in March 2018, low potassium 3.6, otherwise normal CMP, CBC, hemoglobin of 12.9, ESR of 30, A1c of 5.8, C-reactive protein was elevated at 35.5, TSH 1.75  Around 2011, he presented with frequent headaches, there was also reported TIA episode, slurred speech, transient right, sometimes left side weakness, MRI of the brain at that time demonstrated pituitary macroadenoma, over the years, he was followed by neurosurgeon Dr. Tonia Ghent and ophthalmologist Dr. Bing Plume, most recent neurosurgical evaluation was in December 2017, at that visit, there was mention of frequent headaches,  interval enlargement, and upward displacement of optic chiasma, there was also elevated prolactin level 30.6 in 2017, but the patient was not treated with any medications, is worsening headache is due to cervicogenic,   Patient supposed to have his regular neurosurgeon and ophthalmology evaluation, but neurosurgeon left to the practice, he has lost follow-up for about 1 year,  Since summer 2018, he noticed worsening headaches, often starting from upper back, spreading forward to constant moderate to severe daily pressure headache, with mild blurry vision, but he denies significant visual field change, no nausea or vomiting, no lateralized motor or sensory deficit.  He has tried frequent Tylenol, NSAIDs with limited help  UPDATE December 29 2017: His is vision is about same,   I reviewed office note by Dr. Bing Plume on 08/26/2017, there is questionable reliability on visual field testing of both eye, supratemporal depression of both eye, similar appearance to 2017 Visual [place].,  He complains of headadache, 5 out of 10, present 50% of the day, sometimes go up to 10 out of 10, he rely on his wife provide the history, his headache starting from cervical region, spreading to the vertex, and along his spine, He was seen by Dr. Kathyrn Sheriff recently, there was a concern of peri-surgical risk because of his history of significant intracranial atherosclerotic disease,  CTA in Nov 2018: Severe atherosclerosis with chronic bilateral MCA occlusion. Some right MCA branches are visible due to better developed collaterals on the right as seen on 2017 comparison. There is essentially no left MCA branch visualization today. Advanced atherosclerosis in the carotid siphons with bilateral high-grade narrowing. No flow limiting stenosis or occlusion seen in the posterior circulation. Pituitary adenoma with  prominent mass effect on the optic chiasm. The adenoma has progressively enlarged since initial visualization in 2005.  For  his headache, he has tried tramadol without improvement,  Laboratory evaluation seen March 2019 showed elevated prolactin level 34.3, was normal TSH, ACTH, insulin like growth factor,  UPDATE Dec 16th 2019: He is accompanied by his wife at today's clinical visit, continue complains of daily bilateral parietal temporal region headaches, still refused Botox injection as preventive medication, tried Topamax without helping his symptoms, causing dizziness, We reviewed CT angiogram report from [facility] in Nov 2018:  IMPRESSION: 1. Severe atherosclerosis with chronic bilateral MCA occlusion. Some right MCA branches are visible due to better developed collaterals on the right as seen on 2017 comparison. There is essentially no left MCA branch visualization today. 2. Advanced atherosclerosis in the carotid siphons with bilateral high-grade narrowing. 3. No flow limiting stenosis or occlusion seen in the posterior circulation. 4. Pituitary adenoma with prominent mass effect on the optic chiasm. The adenoma has progressively enlarged since initial visualization in 2005.  REVIEW OF SYSTEMS: Full 14 system review of systems performed and notable only for neck pain, neck stiffness, snoring, dizziness, headaches  All rest review of the system were negative  ALLERGIES: Allergies  Allergen Reactions  . Topiramate Other (See Comments)    Fatigue, nausea and weakness    HOME MEDICATIONS: Current Outpatient Medications  Medication Sig Dispense Refill  . amLODipine (NORVASC) 5 MG tablet Take 1 tablet (5 mg total) by mouth daily. 100 tablet 1  . atorvastatin (LIPITOR) 80 MG tablet Take 1 tablet by mouth daily.  4  . butalbital-acetaminophen-caffeine (FIORICET, ESGIC) 50-325-40 MG tablet Take 1 tablet by mouth every 6 (six) hours as needed for headache. Do not refill in less than 30 days 12 tablet 5  . metoprolol succinate (TOPROL-XL) 25 MG 24 hr tablet Take 1 tablet (25 mg total) by mouth  daily. 90 tablet 3  . PARoxetine (PAXIL) 10 MG tablet Take 1 tablet (10 mg total) by mouth daily. At night 30 tablet 0  . traMADol (ULTRAM) 50 MG tablet Take 1 tablet (50 mg total) by mouth every 6 (six) hours as needed. 30 tablet 1  . Vitamin D, Ergocalciferol, (DRISDOL) 50000 units CAPS capsule Take 1 capsule by mouth once a week.  5   No current facility-administered medications for this visit.     PAST MEDICAL HISTORY: Past Medical History:  Diagnosis Date  . Frequent headaches   . Hyperlipidemia   . Hypertension   . Stroke [facility])     PAST SURGICAL HISTORY: History reviewed. No pertinent surgical history.  FAMILY HISTORY: Family History  Problem Relation Age of Onset  . Heart attack Mother   . Other Father        unsure  . Ovarian cancer Sister     SOCIAL HISTORY:  Social History   Socioeconomic History  . Marital status: Married    Spouse name: Not on file  . Number of children: 3  . Years of education: 10th grade  . Highest education level: Not on file  Occupational History  . Occupation: Retired  Scientific laboratory technician  . Financial resource strain: Not on file  . Food insecurity:    Worry: Not on file    Inability: Not on file  . Transportation needs:    Medical: Not on file    Non-medical: Not on file  Tobacco Use  . Smoking status: Former Smoker    Types: Cigarettes  . Smokeless  tobacco: Never Used  . Tobacco comment: 09/23/17 - quit 40+ years ago  Substance and Sexual Activity  . Alcohol use: No    Frequency: Never  . Drug use: No  . Sexual activity: Not on file  Lifestyle  . Physical activity:    Days per week: Not on file    Minutes per session: Not on file  . Stress: Not on file  Relationships  . Social connections:    Talks on phone: Not on file    Gets together: Not on file    Attends religious service: Not on file    Active member of club or organization: Not on file    Attends meetings of clubs or organizations: Not on file    Relationship  status: Not on file  . Intimate partner violence:    Fear of current or ex partner: Not on file    Emotionally abused: Not on file    Physically abused: Not on file    Forced sexual activity: Not on file  Other Topics Concern  . Not on file  Social History Narrative   Lives at home with his wife.   1 cup caffeine per day.   Left-handed.     PHYSICAL EXAM   Vitals:   06/22/18 1437  BP: 136/75  Pulse: 62  Height: _0  (1.727 m)    Not recorded      Body mass index is 26.99 kg/m.  PHYSICAL EXAMNIATION:  Gen: NAD, conversant, well nourised, obese, well groomed                     Cardiovascular: Regular rate rhythm, no peripheral edema, warm, nontender. Eyes: Conjunctivae clear without exudates or hemorrhage Neck: Supple, no carotid bruits. Pulmonary: Clear to auscultation bilaterally   NEUROLOGICAL EXAM:  MENTAL STATUS: Speech:    Speech is normal; fluent and spontaneous with normal comprehension.  Cognition:     Orientation to time, place and person     Normal recent and remote memory     Normal Attention span and concentration     Normal Language, naming, repeating,spontaneous speech     Fund of knowledge   CRANIAL NERVES: CN II: Visual fields are full to confrontation.. Pupils are round equal and briskly reactive to light. CN III, IV, VI: extraocular movement are normal. No ptosis. CN V: Facial sensation is intact to pinprick in all 3 divisions bilaterally. Corneal responses are intact.  CN VII: Face is symmetric with normal eye closure and smile. CN VIII: Hearing is normal to rubbing fingers CN IX, X: Palate elevates symmetrically. Phonation is normal. CN XI: Head turning and shoulder shrug are intact CN XII: Tongue is midline with normal movements and no atrophy.  MOTOR: There is no pronator drift of out-stretched arms. Muscle bulk and tone are normal. Muscle strength is normal.  REFLEXES: Reflexes are 2+ and symmetric at the biceps, triceps, knees,  and ankles. Plantar responses are flexor.  SENSORY: Intact to light touch, pinprick, positional sensation and vibratory sensation are intact in fingers and toes.  COORDINATION: Rapid alternating movements and fine finger movements are intact. There is no dysmetria on finger-to-nose and heel-knee-shin.    GAIT/STANCE: Posture is normal. Gait is steady with normal steps,    DIAGNOSTIC DATA (LABS, IMAGING, TESTING) - I reviewed patient records, labs, notes, testing and imaging myself where available.   ASSESSMENT AND PLAN  Seann Genther is a 77 y.o. male    Chronic headache, Known history  of pituitary macroadenoma Occlusion of bilateral MCA, high-grade stenosis of bilateral saphenous part of internal carotid artery,  He was seen by neurosurgeon Dr. Kathyrn Sheriff, not a good surgical candidate due to his severe intracranial atherosclerotic disease,  Recent visit with his ophthalmologist Dr. Bing Plume for formal visual field testing showed bilateral supratemporal visual field depression,  Persistent headaches likely related to his pituitary macroadenoma, severe bilateral MCA stenosis, high-grade bilateral internal carotid artery stenosis,   I have suggested Botox injection as headache prevention, does not want to proceed  Could not tolerate Topamax 100 mg twice a day  Will try low-dose Depakote ER 250 mg daily as headache prevention  Marcial Pacas, M.D. Ph.D.  [facility] [address], [place], [place] [address] Ph: [phone] Fax: [phone]  CC: Referring Provider

## 2018-08-19 ENCOUNTER — Telehealth: Payer: Self-pay

## 2018-08-19 DIAGNOSIS — G44229 Chronic tension-type headache, not intractable: Secondary | ICD-10-CM

## 2018-08-19 DIAGNOSIS — D352 Benign neoplasm of pituitary gland: Secondary | ICD-10-CM

## 2018-08-19 NOTE — Addendum Note (Signed)
Addended by: Nathanial Millman E on: 08/19/2018 12:03 PM   Modules accepted: Orders

## 2018-08-19 NOTE — Telephone Encounter (Signed)
Copied from [place] [id number]. Topic: Referral - Request for Referral >> Aug 19, 2018 11:32 AM Alanda Slim E wrote: Has patient seen PCP for this complaint? Yes  *If NO, is insurance requiring patient see PCP for this issue before PCP can refer them? Referral for which specialty: neurology  Preferred provider/office: Dr. Ophelia Shoulder [place] in [place] fax# [phone] Reason for referral: other neurologist weren't successful in helping Pt and he rather go to the provider he use to see before

## 2018-08-19 NOTE — Telephone Encounter (Signed)
New referral has been placed.  °

## 2018-08-19 NOTE — Telephone Encounter (Signed)
okay

## 2018-09-03 ENCOUNTER — Telehealth: Payer: Self-pay | Admitting: Family Medicine

## 2018-09-03 NOTE — Telephone Encounter (Signed)
Copied from [place] [id number]. Topic: Quick Communication - See Telephone Encounter >> Sep 03, 2018 10:29 AM Sheran Luz wrote: CRM for notification. See Telephone encounter for: 09/03/18.  Kenney Houseman, with [facility], called stating they did not receive imaging report when referral was sent. Kenney Houseman states that it is important they receive as soon as possible. Please advise.   [facility] cb# [phone]

## 2018-09-03 NOTE — Telephone Encounter (Signed)
I faxed a copy of a 2017 MRI over to the office. No recent imaging was ordered by his previous neurologist. Patient is a former Dr. Katherine Roan patient.

## 2018-12-12 ENCOUNTER — Other Ambulatory Visit: Payer: Self-pay | Admitting: Family Medicine

## 2018-12-12 DIAGNOSIS — I10 Essential (primary) hypertension: Secondary | ICD-10-CM

## 2018-12-14 ENCOUNTER — Telehealth: Payer: Self-pay

## 2018-12-14 ENCOUNTER — Other Ambulatory Visit: Payer: Self-pay | Admitting: Family Medicine

## 2018-12-14 DIAGNOSIS — I10 Essential (primary) hypertension: Secondary | ICD-10-CM

## 2018-12-14 NOTE — Telephone Encounter (Signed)
Pt aware and will call back after the 15th of June to schedule

## 2018-12-14 NOTE — Telephone Encounter (Signed)
Message sent up front to scheduling.

## 2018-12-14 NOTE — Telephone Encounter (Signed)
Can we get patient scheduled for a follow up? In person or telephone is fine. Thank you!

## 2019-01-29 ENCOUNTER — Other Ambulatory Visit: Payer: Self-pay | Admitting: Family Medicine

## 2019-01-29 DIAGNOSIS — I10 Essential (primary) hypertension: Secondary | ICD-10-CM

## 2019-06-10 ENCOUNTER — Other Ambulatory Visit: Payer: Self-pay

## 2019-06-10 DIAGNOSIS — I10 Essential (primary) hypertension: Secondary | ICD-10-CM

## 2019-06-10 MED ORDER — AMLODIPINE BESYLATE 5 MG PO TABS: ORAL_TABLET | ORAL | 1 refills | Status: DC

## 2019-06-14 ENCOUNTER — Other Ambulatory Visit: Payer: Self-pay | Admitting: Family Medicine

## 2019-06-14 DIAGNOSIS — I10 Essential (primary) hypertension: Secondary | ICD-10-CM

## 2019-06-14 MED ORDER — METOPROLOL SUCCINATE ER 25 MG PO TB24: 25.0000 mg | ORAL_TABLET | Freq: Every day | ORAL | 0 refills | Status: DC

## 2019-06-17 ENCOUNTER — Ambulatory Visit (INDEPENDENT_AMBULATORY_CARE_PROVIDER_SITE_OTHER): Payer: Medicare Other | Admitting: Family Medicine

## 2019-06-17 ENCOUNTER — Encounter: Payer: Self-pay | Admitting: Family Medicine

## 2019-06-17 ENCOUNTER — Other Ambulatory Visit: Payer: Self-pay

## 2019-06-17 VITALS — BP 138/80 | HR 78 | Temp 98.0°F | Ht 68.0 in | Wt 180.4 lb

## 2019-06-17 DIAGNOSIS — I10 Essential (primary) hypertension: Secondary | ICD-10-CM | POA: Diagnosis not present

## 2019-06-17 DIAGNOSIS — Z23 Encounter for immunization: Secondary | ICD-10-CM

## 2019-06-17 DIAGNOSIS — E7800 Pure hypercholesterolemia, unspecified: Secondary | ICD-10-CM

## 2019-06-17 DIAGNOSIS — E789 Disorder of lipoprotein metabolism, unspecified: Secondary | ICD-10-CM

## 2019-06-17 DIAGNOSIS — R319 Hematuria, unspecified: Secondary | ICD-10-CM | POA: Diagnosis not present

## 2019-06-17 MED ORDER — ATORVASTATIN CALCIUM 80 MG PO TABS: 80.0000 mg | ORAL_TABLET | Freq: Every day | ORAL | 4 refills | Status: DC

## 2019-06-17 MED ORDER — AMLODIPINE BESYLATE 5 MG PO TABS: ORAL_TABLET | ORAL | 1 refills | Status: DC

## 2019-06-17 MED ORDER — METOPROLOL SUCCINATE ER 25 MG PO TB24: ORAL_TABLET | ORAL | 1 refills | Status: DC

## 2019-06-17 NOTE — Progress Notes (Addendum)
Established Patient Office Visit  Subjective:  Patient ID: Jose Vaughn, male    DOB: 1940/08/22  Age: 78 y.o. MRN: [id number]  CC:  Chief Complaint  Patient presents with  . Annual Exam    HPI Jose Vaughn presents for follow-up of his hypertension and elevated cholesterol.  Blood pressure has been controlled with the metoprolol and amlodipine.  Continues to take high-dose atorvastatin for his cholesterol.  Unfortunately his chronic headaches persist.  He continues follow-up with his neurologist.  Past Medical History:  Diagnosis Date  . Frequent headaches   . Hyperlipidemia   . Hypertension   . Stroke [facility])     History reviewed. No pertinent surgical history.  Family History  Problem Relation Age of Onset  . Heart attack Mother   . Other Father        unsure  . Ovarian cancer Sister     Social History   Socioeconomic History  . Marital status: Married    Spouse name: Not on file  . Number of children: 3  . Years of education: 10th grade  . Highest education level: Not on file  Occupational History  . Occupation: Retired  Tobacco Use  . Smoking status: Former Smoker    Types: Cigarettes  . Smokeless tobacco: Never Used  . Tobacco comment: 09/23/17 - quit 40+ years ago  Substance and Sexual Activity  . Alcohol use: No  . Drug use: No  . Sexual activity: Not on file  Other Topics Concern  . Not on file  Social History Narrative   Lives at home with his wife.   1 cup caffeine per day.   Left-handed.   Social Determinants of Health   Financial Resource Strain:   . Difficulty of Paying Living Expenses: Not on file  Food Insecurity:   . Worried About Charity fundraiser in the Last Year: Not on file  . Ran Out of Food in the Last Year: Not on file  Transportation Needs:   . Lack of Transportation (Medical): Not on file  . Lack of Transportation (Non-Medical): Not on file  Physical Activity:   . Days of Exercise per Week: Not on file  . Minutes of  Exercise per Session: Not on file  Stress:   . Feeling of Stress : Not on file  Social Connections:   . Frequency of Communication with Friends and Family: Not on file  . Frequency of Social Gatherings with Friends and Family: Not on file  . Attends Religious Services: Not on file  . Active Member of Clubs or Organizations: Not on file  . Attends Archivist Meetings: Not on file  . Marital Status: Not on file  Intimate Partner Violence:   . Fear of Current or Ex-Partner: Not on file  . Emotionally Abused: Not on file  . Physically Abused: Not on file  . Sexually Abused: Not on file    Outpatient Medications Prior to Visit  Medication Sig Dispense Refill  . aspirin EC 81 MG tablet Take 81 mg by mouth daily.    [name] imipramine (TOFRANIL) 25 MG tablet Take 25 mg by mouth at bedtime.    [name] amLODipine (NORVASC) 5 MG tablet TAKE 1 TABLET(5 MG) BY MOUTH DAILY 90 tablet 1  . atorvastatin (LIPITOR) 80 MG tablet Take 1 tablet by mouth daily.  4  . metoprolol succinate (TOPROL-XL) 25 MG 24 hr tablet TAKE 1 TABLET(25 MG) BY MOUTH DAILY 90 tablet 1  . divalproex (DEPAKOTE ER)  250 MG 24 hr tablet Take 1 tablet (250 mg total) by mouth daily. (Patient not taking: Reported on 06/17/2019) 30 tablet 11  . PARoxetine (PAXIL) 10 MG tablet Take 1 tablet (10 mg total) by mouth daily. At night (Patient not taking: Reported on 06/17/2019) 30 tablet 0  . Vitamin D, Ergocalciferol, (DRISDOL) 50000 units CAPS capsule Take 1 capsule by mouth once a week.  5  . butalbital-acetaminophen-caffeine (FIORICET, ESGIC) 50-325-40 MG tablet Take 1 tablet by mouth every 6 (six) hours as needed for headache. Do not refill in less than 30 days (Patient not taking: Reported on 06/17/2019) 12 tablet 5  . traMADol (ULTRAM) 50 MG tablet Take 1 tablet (50 mg total) by mouth every 6 (six) hours as needed. (Patient not taking: Reported on 06/17/2019) 30 tablet 1   No facility-administered medications prior to visit.     Allergies  Allergen Reactions  . Topiramate Other (See Comments)    Fatigue, nausea and weakness    ROS Review of Systems  Constitutional: Negative for chills, diaphoresis, fatigue, fever and unexpected weight change.  HENT: Negative.   Eyes: Negative for photophobia and visual disturbance.  Respiratory: Negative.  Negative for chest tightness and shortness of breath.   Cardiovascular: Negative for chest pain.  Gastrointestinal: Negative.  Negative for constipation and nausea.  Genitourinary: Negative for difficulty urinating, frequency and urgency.  Musculoskeletal: Negative.  Negative for gait problem and joint swelling.  Skin: Negative for pallor.  Allergic/Immunologic: Negative for immunocompromised state.  Neurological: Positive for headaches. Negative for seizures, syncope, weakness and light-headedness.  Hematological: Does not bruise/bleed easily.  Psychiatric/Behavioral: Negative.       Objective:    Physical Exam  Constitutional: He is oriented to person, place, and time. He appears well-developed and well-nourished. No distress.  HENT:  Head: Normocephalic and atraumatic.  Right Ear: External ear normal.  Left Ear: External ear normal.  Mouth/Throat: Oropharynx is clear and moist.  Eyes: Conjunctivae are normal. Right eye exhibits no discharge. Left eye exhibits no discharge. No scleral icterus.  Neck: No JVD present. No tracheal deviation present. No thyromegaly present.  Cardiovascular: Normal rate, regular rhythm and normal heart sounds.  Pulmonary/Chest: Effort normal and breath sounds normal. No stridor.  Abdominal: Bowel sounds are normal.  Musculoskeletal:        General: No edema.  Lymphadenopathy:    He has no cervical adenopathy.  Neurological: He is alert and oriented to person, place, and time.  Skin: Skin is warm and dry. He is not diaphoretic.  Psychiatric: He has a normal mood and affect. His behavior is normal.    BP 138/80   Pulse 78    Temp 98 F (36.7 C)   Ht 5' 8"  (1.727 m)   Wt 180 lb 6.4 oz (81.8 kg)   SpO2 98%   BMI 27.43 kg/m  Wt Readings from Last 3 Encounters:  06/17/19 180 lb 6.4 oz (81.8 kg)  04/01/18 177 lb 8 oz (80.5 kg)  12/29/17 178 lb (80.7 kg)     Health Maintenance Due  Topic Date Due  . TETANUS/TDAP  12/10/1960  . PNA vac Low Risk Adult (2 of 2 - PPSV23) 09/26/2015    There are no preventive care reminders to display for this patient.  Lab Results  Component Value Date   TSH 1.760 12/29/2017   Lab Results  Component Value Date   WBC 9.0 06/17/2019   HGB 12.8 (L) 06/17/2019   HCT 39.1 06/17/2019   MCV  91.5 06/17/2019   PLT 220.0 06/17/2019   Lab Results  Component Value Date   NA 139 06/17/2019   K 4.0 06/17/2019   CO2 24 06/17/2019   GLUCOSE 84 06/17/2019   BUN 25 (H) 06/17/2019   CREATININE 1.40 06/17/2019   BILITOT 0.8 06/17/2019   ALKPHOS 85 06/17/2019   AST 20 06/17/2019   ALT 12 06/17/2019   PROT 7.1 06/17/2019   ALBUMIN 4.1 06/17/2019   CALCIUM 9.0 06/17/2019   GFR 59.38 (L) 06/17/2019   Lab Results  Component Value Date   CHOL 284 (H) 06/17/2019   Lab Results  Component Value Date   HDL 34.90 (L) 06/17/2019   Lab Results  Component Value Date   LDLCALC 210 (H) 06/17/2019   Lab Results  Component Value Date   TRIG 193.0 (H) 06/17/2019   Lab Results  Component Value Date   CHOLHDL 8 06/17/2019   No results found for: HGBA1C    Assessment & Plan:   Problem List Items Addressed This Visit      Cardiovascular and Mediastinum   Essential hypertension   Relevant Medications   aspirin EC 81 MG tablet   amLODipine (NORVASC) 5 MG tablet   metoprolol succinate (TOPROL-XL) 25 MG 24 hr tablet   atorvastatin (LIPITOR) 80 MG tablet   Other Relevant Orders   CBC (Completed)   Comp Met (CMET) (Completed)     Other   Need for influenza vaccination - Primary   Relevant Orders   Flu Vaccine QUAD High Dose(Fluad) (Completed)   Elevated serum  cholesterol   Relevant Medications   atorvastatin (LIPITOR) 80 MG tablet   Other Relevant Orders   Comp Met (CMET) (Completed)   Direct LDL (Completed)   Lipid Profile (Completed)   Hematuria   Relevant Orders   Urinalysis, Routine w reflex microscopic (Completed)      Meds ordered this encounter  Medications  . amLODipine (NORVASC) 5 MG tablet    Sig: TAKE 1 TABLET(5 MG) BY MOUTH DAILY    Dispense:  90 tablet    Refill:  1    **Patient requests 90 days supply**  . DISCONTD: atorvastatin (LIPITOR) 80 MG tablet    Sig: Take 1 tablet (80 mg total) by mouth daily.    Dispense:  90 tablet    Refill:  4  . metoprolol succinate (TOPROL-XL) 25 MG 24 hr tablet    Sig: TAKE 1 TABLET(25 MG) BY MOUTH DAILY    Dispense:  90 tablet    Refill:  1    **Patient requests 90 days supply**  . atorvastatin (LIPITOR) 80 MG tablet    Sig: Take 1 tablet (80 mg total) by mouth daily.    Dispense:  90 tablet    Refill:  4    Follow-up: Return in about 3 months (around 09/15/2019).    [name], MD

## 2019-06-18 LAB — URINALYSIS, ROUTINE W REFLEX MICROSCOPIC
Bilirubin Urine: NEGATIVE
Ketones, ur: NEGATIVE
Leukocytes,Ua: NEGATIVE
Nitrite: NEGATIVE
Specific Gravity, Urine: >=1.03 — AB (ref 1.000–1.030)
Total Protein, Urine: NEGATIVE
Urine Glucose: NEGATIVE
Urobilinogen, UA: 0.2 (ref 0.0–1.0)
pH: 5.5 (ref 5.0–8.0)

## 2019-06-18 LAB — CBC
HCT: 39.1 % (ref 39.0–52.0)
Hemoglobin: 12.8 g/dL — ABNORMAL LOW (ref 13.0–17.0)
MCHC: 32.8 g/dL (ref 30.0–36.0)
MCV: 91.5 fl (ref 78.0–100.0)
Platelets: 220 10*3/uL (ref 150.0–400.0)
RBC: 4.28 Mil/uL (ref 4.22–5.81)
RDW: 13.5 % (ref 11.5–15.5)
WBC: 9 10*3/uL (ref 4.0–10.5)

## 2019-06-18 LAB — LIPID PANEL
Cholesterol: 284 mg/dL — ABNORMAL HIGH (ref 0–200)
HDL: 34.9 mg/dL — ABNORMAL LOW (ref >39.00)
LDL Cholesterol: 210 mg/dL — ABNORMAL HIGH (ref 0–99)
NonHDL: 248.99
Total CHOL/HDL Ratio: 8
Triglycerides: 193 mg/dL — ABNORMAL HIGH (ref 0.0–149.0)
VLDL: 38.6 mg/dL (ref 0.0–40.0)

## 2019-06-18 LAB — COMPREHENSIVE METABOLIC PANEL
ALT: 12 U/L (ref 0–53)
AST: 20 U/L (ref 0–37)
Albumin: 4.1 g/dL (ref 3.5–5.2)
Alkaline Phosphatase: 85 U/L (ref 39–117)
BUN: 25 mg/dL — ABNORMAL HIGH (ref 6–23)
CO2: 24 mEq/L (ref 19–32)
Calcium: 9 mg/dL (ref 8.4–10.5)
Chloride: 104 mEq/L (ref 96–112)
Creatinine, Ser: 1.4 mg/dL (ref 0.40–1.50)
GFR: 59.38 mL/min — ABNORMAL LOW (ref >60.00)
Glucose, Bld: 84 mg/dL (ref 70–99)
Potassium: 4 mEq/L (ref 3.5–5.1)
Sodium: 139 mEq/L (ref 135–145)
Total Bilirubin: 0.8 mg/dL (ref 0.2–1.2)
Total Protein: 7.1 g/dL (ref 6.0–8.3)

## 2019-06-18 LAB — LDL CHOLESTEROL, DIRECT: Direct LDL: 186 mg/dL

## 2019-06-23 MED ORDER — ATORVASTATIN CALCIUM 80 MG PO TABS: 80.0000 mg | ORAL_TABLET | Freq: Every day | ORAL | 4 refills | Status: DC

## 2019-06-23 NOTE — Progress Notes (Signed)
Labs show that cholesterol is extremely elevated. Have sent a new prescription for your atorvastatin. Please take it daily and we will recheck it in 3 months.

## 2019-06-23 NOTE — Addendum Note (Signed)
Addended by: Jon Billings on: 06/23/2019 11:42 AM   Modules accepted: Orders

## 2019-09-16 ENCOUNTER — Ambulatory Visit: Payer: Medicare Other | Admitting: Family Medicine

## 2019-09-20 ENCOUNTER — Ambulatory Visit (INDEPENDENT_AMBULATORY_CARE_PROVIDER_SITE_OTHER): Payer: Medicare Other | Admitting: Family Medicine

## 2019-09-20 ENCOUNTER — Encounter: Payer: Self-pay | Admitting: Family Medicine

## 2019-09-20 ENCOUNTER — Other Ambulatory Visit: Payer: Self-pay

## 2019-09-20 VITALS — BP 138/78 | HR 57 | Temp 97.3°F | Ht 68.0 in | Wt 180.1 lb

## 2019-09-20 DIAGNOSIS — E789 Disorder of lipoprotein metabolism, unspecified: Secondary | ICD-10-CM

## 2019-09-20 DIAGNOSIS — I10 Essential (primary) hypertension: Secondary | ICD-10-CM

## 2019-09-20 DIAGNOSIS — R319 Hematuria, unspecified: Secondary | ICD-10-CM

## 2019-09-20 DIAGNOSIS — E559 Vitamin D deficiency, unspecified: Secondary | ICD-10-CM | POA: Diagnosis not present

## 2019-09-20 DIAGNOSIS — D649 Anemia, unspecified: Secondary | ICD-10-CM | POA: Diagnosis not present

## 2019-09-20 MED ORDER — ATORVASTATIN CALCIUM 80 MG PO TABS: 80.0000 mg | ORAL_TABLET | Freq: Every day | ORAL | 4 refills | Status: DC

## 2019-09-20 MED ORDER — METOPROLOL SUCCINATE ER 25 MG PO TB24: ORAL_TABLET | ORAL | 1 refills | Status: DC

## 2019-09-20 MED ORDER — AMLODIPINE BESYLATE 5 MG PO TABS: ORAL_TABLET | ORAL | 1 refills | Status: DC

## 2019-09-20 NOTE — Progress Notes (Signed)
Established Patient Office Visit  Subjective:  Patient ID: Jose Vaughn, male    DOB: 06-12-1941  Age: 79 y.o. MRN: [id number]  CC:  Chief Complaint  Patient presents with  . Follow-up    3 month follow up on BP and cholesterol, no concerns.     HPI Jose Vaughn presents for follow-up of his hypertension, elevated cholesterol and anemia.  Unfortunately he says that the headaches are about the same.  Continues to see neurology.  Assures compliance with his high-dose atorvastatin.  Continues to take high-dose vitamin D intermittently.  Not sure who has given him the medication.  Okay here comes his AVS  Past Medical History:  Diagnosis Date  . Frequent headaches   . Hyperlipidemia   . Hypertension   . Stroke [facility])     History reviewed. No pertinent surgical history.  Family History  Problem Relation Age of Onset  . Heart attack Mother   . Other Father        unsure  . Ovarian cancer Sister     Social History   Socioeconomic History  . Marital status: Married    Spouse name: Not on file  . Number of children: 3  . Years of education: 10th grade  . Highest education level: Not on file  Occupational History  . Occupation: Retired  Tobacco Use  . Smoking status: Former Smoker    Types: Cigarettes  . Smokeless tobacco: Never Used  . Tobacco comment: 09/23/17 - quit 40+ years ago  Substance and Sexual Activity  . Alcohol use: No  . Drug use: No  . Sexual activity: Not on file  Other Topics Concern  . Not on file  Social History Narrative   Lives at home with his wife.   1 cup caffeine per day.   Left-handed.   Social Determinants of Health   Financial Resource Strain:   . Difficulty of Paying Living Expenses:   Food Insecurity:   . Worried About Charity fundraiser in the Last Year:   . Arboriculturist in the Last Year:   Transportation Needs:   . Film/video editor (Medical):   [name] Lack of Transportation (Non-Medical):   Physical Activity:   . Days of  Exercise per Week:   . Minutes of Exercise per Session:   Stress:   . Feeling of Stress :   Social Connections:   . Frequency of Communication with Friends and Family:   . Frequency of Social Gatherings with Friends and Family:   . Attends Religious Services:   . Active Member of Clubs or Organizations:   . Attends Archivist Meetings:   [name] Marital Status:   Intimate Partner Violence:   . Fear of Current or Ex-Partner:   . Emotionally Abused:   [name] Physically Abused:   . Sexually Abused:     Outpatient Medications Prior to Visit  Medication Sig Dispense Refill  . aspirin EC 81 MG tablet Take 81 mg by mouth daily.    [name] imipramine (TOFRANIL) 25 MG tablet Take 25 mg by mouth at bedtime.    . Vitamin D, Ergocalciferol, (DRISDOL) 50000 units CAPS capsule Take 1 capsule by mouth once a week.  5  . amLODipine (NORVASC) 5 MG tablet TAKE 1 TABLET(5 MG) BY MOUTH DAILY 90 tablet 1  . atorvastatin (LIPITOR) 80 MG tablet Take 1 tablet (80 mg total) by mouth daily. 90 tablet 4  . metoprolol succinate (TOPROL-XL) 25 MG 24 hr tablet  TAKE 1 TABLET(25 MG) BY MOUTH DAILY 90 tablet 1  . divalproex (DEPAKOTE ER) 250 MG 24 hr tablet Take 1 tablet (250 mg total) by mouth daily. (Patient not taking: Reported on 06/17/2019) 30 tablet 11  . PARoxetine (PAXIL) 10 MG tablet Take 1 tablet (10 mg total) by mouth daily. At night (Patient not taking: Reported on 06/17/2019) 30 tablet 0   No facility-administered medications prior to visit.    Allergies  Allergen Reactions  . Topiramate Other (See Comments)    Fatigue, nausea and weakness    ROS Review of Systems    Objective:    Physical Exam  BP 138/78   Pulse (!) 57   Temp (!) 97.3 F (36.3 C) (Tympanic)   Ht 5\' 8"  (1.727 m)   Wt 180 lb 1.6 oz (81.7 kg)   SpO2 97%   BMI 27.38 kg/m  Wt Readings from Last 3 Encounters:  09/20/19 180 lb 1.6 oz (81.7 kg)  06/17/19 180 lb 6.4 oz (81.8 kg)  04/01/18 177 lb 8 oz (80.5 kg)     Health  Maintenance Due  Topic Date Due  . TETANUS/TDAP  Never done  . PNA vac Low Risk Adult (2 of 2 - PPSV23) 09/26/2015    There are no preventive care reminders to display for this patient.  Lab Results  Component Value Date   TSH 1.760 12/29/2017   Lab Results  Component Value Date   WBC 9.0 06/17/2019   HGB 12.8 (L) 06/17/2019   HCT 39.1 06/17/2019   MCV 91.5 06/17/2019   PLT 220.0 06/17/2019   Lab Results  Component Value Date   NA 139 06/17/2019   K 4.0 06/17/2019   CO2 24 06/17/2019   GLUCOSE 84 06/17/2019   BUN 25 (H) 06/17/2019   CREATININE 1.40 06/17/2019   BILITOT 0.8 06/17/2019   ALKPHOS 85 06/17/2019   AST 20 06/17/2019   ALT 12 06/17/2019   PROT 7.1 06/17/2019   ALBUMIN 4.1 06/17/2019   CALCIUM 9.0 06/17/2019   GFR 59.38 (L) 06/17/2019   Lab Results  Component Value Date   CHOL 284 (H) 06/17/2019   Lab Results  Component Value Date   HDL 34.90 (L) 06/17/2019   Lab Results  Component Value Date   LDLCALC 210 (H) 06/17/2019   Lab Results  Component Value Date   TRIG 193.0 (H) 06/17/2019   Lab Results  Component Value Date   CHOLHDL 8 06/17/2019   No results found for: HGBA1C    Assessment & Plan:   Problem List Items Addressed This Visit      Cardiovascular and Mediastinum   Essential hypertension - Primary   Relevant Medications   atorvastatin (LIPITOR) 80 MG tablet   metoprolol succinate (TOPROL-XL) 25 MG 24 hr tablet   amLODipine (NORVASC) 5 MG tablet   Other Relevant Orders   Comprehensive metabolic panel     Other   Elevated serum cholesterol   Relevant Medications   atorvastatin (LIPITOR) 80 MG tablet   Other Relevant Orders   LDL cholesterol, direct   Anemia   Relevant Orders   CBC   Iron, TIBC and Ferritin Panel   B12 and Folate Panel   Urinalysis, Routine w reflex microscopic   Hematuria   Relevant Orders   Urinalysis, Routine w reflex microscopic   Vitamin D deficiency   Relevant Orders   VITAMIN D 25 Hydroxy  (Vit-D Deficiency, Fractures)      Meds ordered this encounter  Medications  .  atorvastatin (LIPITOR) 80 MG tablet    Sig: Take 1 tablet (80 mg total) by mouth daily.    Dispense:  90 tablet    Refill:  4  . metoprolol succinate (TOPROL-XL) 25 MG 24 hr tablet    Sig: TAKE 1 TABLET(25 MG) BY MOUTH DAILY    Dispense:  90 tablet    Refill:  1    **Patient requests 90 days supply**  . amLODipine (NORVASC) 5 MG tablet    Sig: TAKE 1 TABLET(5 MG) BY MOUTH DAILY    Dispense:  90 tablet    Refill:  1    **Patient requests 90 days supply**    Follow-up: Return in about 3 months (around 12/21/2019).    [name], MD

## 2019-09-21 LAB — COMPREHENSIVE METABOLIC PANEL
ALT: 13 U/L (ref 0–53)
AST: 21 U/L (ref 0–37)
Albumin: 3.6 g/dL (ref 3.5–5.2)
Alkaline Phosphatase: 79 U/L (ref 39–117)
BUN: 22 mg/dL (ref 6–23)
CO2: 28 mEq/L (ref 19–32)
Calcium: 8.6 mg/dL (ref 8.4–10.5)
Chloride: 106 mEq/L (ref 96–112)
Creatinine, Ser: 1.3 mg/dL (ref 0.40–1.50)
GFR: 64.64 mL/min (ref >60.00)
Glucose, Bld: 107 mg/dL — ABNORMAL HIGH (ref 70–99)
Potassium: 4.1 mEq/L (ref 3.5–5.1)
Sodium: 139 mEq/L (ref 135–145)
Total Bilirubin: 0.6 mg/dL (ref 0.2–1.2)
Total Protein: 6.5 g/dL (ref 6.0–8.3)

## 2019-09-21 LAB — URINALYSIS, ROUTINE W REFLEX MICROSCOPIC
Bilirubin Urine: NEGATIVE
Ketones, ur: NEGATIVE
Leukocytes,Ua: NEGATIVE
Nitrite: NEGATIVE
Specific Gravity, Urine: 1.025 (ref 1.000–1.030)
Total Protein, Urine: NEGATIVE
Urine Glucose: NEGATIVE
Urobilinogen, UA: 0.2 (ref 0.0–1.0)
WBC, UA: NONE SEEN (ref 0–?)
pH: 5.5 (ref 5.0–8.0)

## 2019-09-21 LAB — B12 AND FOLATE PANEL
Folate: 12.8 ng/mL (ref >5.9)
Vitamin B-12: 226 pg/mL (ref 211–911)

## 2019-09-21 LAB — LDL CHOLESTEROL, DIRECT: Direct LDL: 86 mg/dL

## 2019-09-21 LAB — CBC
HCT: 35.9 % — ABNORMAL LOW (ref 39.0–52.0)
Hemoglobin: 12.1 g/dL — ABNORMAL LOW (ref 13.0–17.0)
MCHC: 33.7 g/dL (ref 30.0–36.0)
MCV: 91.3 fl (ref 78.0–100.0)
Platelets: 162 10*3/uL (ref 150.0–400.0)
RBC: 3.94 Mil/uL — ABNORMAL LOW (ref 4.22–5.81)
RDW: 13.6 % (ref 11.5–15.5)
WBC: 7.2 10*3/uL (ref 4.0–10.5)

## 2019-09-21 LAB — IRON,TIBC AND FERRITIN PANEL
%SAT: 28 % (calc) (ref 20–48)
Ferritin: 218 ng/mL (ref 24–380)
Iron: 55 ug/dL (ref 50–180)
TIBC: 197 mcg/dL (calc) — ABNORMAL LOW (ref 250–425)

## 2019-09-21 LAB — VITAMIN D 25 HYDROXY (VIT D DEFICIENCY, FRACTURES): VITD: 31.23 ng/mL (ref 30.00–100.00)

## 2019-09-22 DIAGNOSIS — G4733 Obstructive sleep apnea (adult) (pediatric): Secondary | ICD-10-CM | POA: Diagnosis not present

## 2019-09-22 DIAGNOSIS — G43709 Chronic migraine without aura, not intractable, without status migrainosus: Secondary | ICD-10-CM | POA: Diagnosis not present

## 2019-09-22 DIAGNOSIS — D353 Benign neoplasm of craniopharyngeal duct: Secondary | ICD-10-CM | POA: Diagnosis not present

## 2019-09-22 DIAGNOSIS — D352 Benign neoplasm of pituitary gland: Secondary | ICD-10-CM | POA: Diagnosis not present

## 2019-10-13 ENCOUNTER — Telehealth: Payer: Self-pay | Admitting: Family Medicine

## 2019-10-13 NOTE — Telephone Encounter (Signed)
Attempted to schedule AWV. Unable to LVM.  Will try at later time. No voicemail setup 

## 2019-11-09 DIAGNOSIS — G43709 Chronic migraine without aura, not intractable, without status migrainosus: Secondary | ICD-10-CM | POA: Diagnosis not present

## 2019-12-21 ENCOUNTER — Ambulatory Visit (INDEPENDENT_AMBULATORY_CARE_PROVIDER_SITE_OTHER): Payer: Medicare Other | Admitting: Family Medicine

## 2019-12-21 ENCOUNTER — Encounter: Payer: Self-pay | Admitting: Family Medicine

## 2019-12-21 ENCOUNTER — Other Ambulatory Visit: Payer: Self-pay

## 2019-12-21 VITALS — BP 122/70 | HR 77 | Temp 97.6°F | Ht 68.0 in | Wt 173.0 lb

## 2019-12-21 DIAGNOSIS — I10 Essential (primary) hypertension: Secondary | ICD-10-CM

## 2019-12-21 DIAGNOSIS — E789 Disorder of lipoprotein metabolism, unspecified: Secondary | ICD-10-CM | POA: Diagnosis not present

## 2019-12-21 DIAGNOSIS — D649 Anemia, unspecified: Secondary | ICD-10-CM

## 2019-12-21 LAB — CBC
HCT: 36.2 % — ABNORMAL LOW (ref 39.0–52.0)
Hemoglobin: 12.2 g/dL — ABNORMAL LOW (ref 13.0–17.0)
MCHC: 33.6 g/dL (ref 30.0–36.0)
MCV: 91.3 fl (ref 78.0–100.0)
Platelets: 184 10*3/uL (ref 150.0–400.0)
RBC: 3.96 Mil/uL — ABNORMAL LOW (ref 4.22–5.81)
RDW: 13.4 % (ref 11.5–15.5)
WBC: 8.1 10*3/uL (ref 4.0–10.5)

## 2019-12-21 NOTE — Progress Notes (Signed)
Established Patient Office Visit  Subjective:  Patient ID: Jose Vaughn, male    DOB: February 02, 1941  Age: 79 y.o. MRN: [id number]  CC:  Chief Complaint  Patient presents with  . Follow-up    3 month follow up on BP, no concerns.     HPI Jose Vaughn presents for follow-up of his hypertension, elevated cholesterol and anemia.  Taking all medicines as prescribed.  He did start a multivitamin with iron.  Continues high-dose vitamin D.  Past Medical History:  Diagnosis Date  . Frequent headaches   . Hyperlipidemia   . Hypertension   . Stroke [facility])     No past surgical history on file.  Family History  Problem Relation Age of Onset  . Heart attack Mother   . Other Father        unsure  . Ovarian cancer Sister     Social History   Socioeconomic History  . Marital status: Married    Spouse name: Not on file  . Number of children: 3  . Years of education: 10th grade  . Highest education level: Not on file  Occupational History  . Occupation: Retired  Tobacco Use  . Smoking status: Former Smoker    Types: Cigarettes  . Smokeless tobacco: Never Used  . Tobacco comment: 09/23/17 - quit 40+ years ago  Vaping Use  . Vaping Use: Never used  Substance and Sexual Activity  . Alcohol use: No  . Drug use: No  . Sexual activity: Not on file  Other Topics Concern  . Not on file  Social History Narrative   Lives at home with his wife.   1 cup caffeine per day.   Left-handed.   Social Determinants of Health   Financial Resource Strain:   . Difficulty of Paying Living Expenses:   Food Insecurity:   . Worried About Charity fundraiser in the Last Year:   . Arboriculturist in the Last Year:   Transportation Needs:   . Film/video editor (Medical):   [name] Lack of Transportation (Non-Medical):   Physical Activity:   . Days of Exercise per Week:   . Minutes of Exercise per Session:   Stress:   . Feeling of Stress :   Social Connections:   . Frequency of Communication  with Friends and Family:   . Frequency of Social Gatherings with Friends and Family:   . Attends Religious Services:   . Active Member of Clubs or Organizations:   . Attends Archivist Meetings:   [name] Marital Status:   Intimate Partner Violence:   . Fear of Current or Ex-Partner:   . Emotionally Abused:   [name] Physically Abused:   . Sexually Abused:     Outpatient Medications Prior to Visit  Medication Sig Dispense Refill  . amLODipine (NORVASC) 5 MG tablet TAKE 1 TABLET(5 MG) BY MOUTH DAILY 90 tablet 1  . aspirin EC 81 MG tablet Take 81 mg by mouth daily.    [name] atorvastatin (LIPITOR) 80 MG tablet Take 1 tablet (80 mg total) by mouth daily. 90 tablet 4  . imipramine (TOFRANIL) 25 MG tablet Take 25 mg by mouth at bedtime.    . metoprolol succinate (TOPROL-XL) 25 MG 24 hr tablet TAKE 1 TABLET(25 MG) BY MOUTH DAILY 90 tablet 1  . Vitamin D, Ergocalciferol, (DRISDOL) 50000 units CAPS capsule Take 1 capsule by mouth once a week.  5  . divalproex (DEPAKOTE ER) 250 MG 24 hr tablet  Take 1 tablet (250 mg total) by mouth daily. (Patient not taking: Reported on 06/17/2019) 30 tablet 11  . PARoxetine (PAXIL) 10 MG tablet Take 1 tablet (10 mg total) by mouth daily. At night (Patient not taking: Reported on 06/17/2019) 30 tablet 0   No facility-administered medications prior to visit.    Allergies  Allergen Reactions  . Topiramate Other (See Comments)    Fatigue, nausea and weakness    ROS Review of Systems  Constitutional: Negative.   Respiratory: Negative.   Cardiovascular: Negative.   Gastrointestinal: Negative.   Neurological: Positive for headaches.      Objective:    Physical Exam Vitals and nursing note reviewed.  Constitutional:      Appearance: Normal appearance.  HENT:     Right Ear: External ear normal.     Left Ear: External ear normal.  Eyes:     General: No scleral icterus.       Right eye: No discharge.        Left eye: No discharge.      Conjunctiva/sclera: Conjunctivae normal.  Cardiovascular:     Rate and Rhythm: Normal rate and regular rhythm.  Pulmonary:     Effort: Pulmonary effort is normal.     Breath sounds: Normal breath sounds.  Neurological:     Mental Status: He is alert and oriented to person, place, and time.  Psychiatric:        Mood and Affect: Mood normal.        Behavior: Behavior normal.     BP 122/70   Pulse 77   Temp 97.6 F (36.4 C) (Tympanic)   Ht 5\' 8"  (1.727 m)   Wt 173 lb (78.5 kg)   SpO2 96%   BMI 26.30 kg/m  Wt Readings from Last 3 Encounters:  12/21/19 173 lb (78.5 kg)  09/20/19 180 lb 1.6 oz (81.7 kg)  06/17/19 180 lb 6.4 oz (81.8 kg)     Health Maintenance Due  Topic Date Due  . Hepatitis C Screening  Never done  . COVID-19 Vaccine (1) Never done  . TETANUS/TDAP  Never done  . PNA vac Low Risk Adult (2 of 2 - PPSV23) 09/26/2015    There are no preventive care reminders to display for this patient.  Lab Results  Component Value Date   TSH 1.760 12/29/2017   Lab Results  Component Value Date   WBC 7.2 09/20/2019   HGB 12.1 (L) 09/20/2019   HCT 35.9 (L) 09/20/2019   MCV 91.3 09/20/2019   PLT 162.0 09/20/2019   Lab Results  Component Value Date   NA 139 09/20/2019   K 4.1 09/20/2019   CO2 28 09/20/2019   GLUCOSE 107 (H) 09/20/2019   BUN 22 09/20/2019   CREATININE 1.30 09/20/2019   BILITOT 0.6 09/20/2019   ALKPHOS 79 09/20/2019   AST 21 09/20/2019   ALT 13 09/20/2019   PROT 6.5 09/20/2019   ALBUMIN 3.6 09/20/2019   CALCIUM 8.6 09/20/2019   GFR 64.64 09/20/2019   Lab Results  Component Value Date   CHOL 284 (H) 06/17/2019   Lab Results  Component Value Date   HDL 34.90 (L) 06/17/2019   Lab Results  Component Value Date   LDLCALC 210 (H) 06/17/2019   Lab Results  Component Value Date   TRIG 193.0 (H) 06/17/2019   Lab Results  Component Value Date   CHOLHDL 8 06/17/2019   No results found for: HGBA1C    Assessment & Plan:  Problem  List Items Addressed This Visit      Cardiovascular and Mediastinum   Essential hypertension - Primary     Other   Elevated serum cholesterol   Anemia   Relevant Orders   CBC   Iron, TIBC and Ferritin Panel      No orders of the defined types were placed in this encounter.   Follow-up: Return in about 6 months (around 06/21/2020), or continue atorvastatin, multivitamin with iron and blood pressure medicines.  Good luck with your new headache doctors I hope that they can help.   [name], MD

## 2019-12-22 LAB — IRON,TIBC AND FERRITIN PANEL
%SAT: 31 % (calc) (ref 20–48)
Ferritin: 221 ng/mL (ref 24–380)
Iron: 67 ug/dL (ref 50–180)
TIBC: 216 mcg/dL (calc) — ABNORMAL LOW (ref 250–425)

## 2020-02-01 DIAGNOSIS — G43709 Chronic migraine without aura, not intractable, without status migrainosus: Secondary | ICD-10-CM | POA: Diagnosis not present

## 2020-02-08 DIAGNOSIS — D352 Benign neoplasm of pituitary gland: Secondary | ICD-10-CM | POA: Diagnosis not present

## 2020-02-08 DIAGNOSIS — I639 Cerebral infarction, unspecified: Secondary | ICD-10-CM | POA: Diagnosis not present

## 2020-02-08 DIAGNOSIS — G43709 Chronic migraine without aura, not intractable, without status migrainosus: Secondary | ICD-10-CM | POA: Diagnosis not present

## 2020-02-08 DIAGNOSIS — D353 Benign neoplasm of craniopharyngeal duct: Secondary | ICD-10-CM | POA: Diagnosis not present

## 2020-03-15 DIAGNOSIS — D352 Benign neoplasm of pituitary gland: Secondary | ICD-10-CM | POA: Diagnosis not present

## 2020-03-21 ENCOUNTER — Telehealth: Payer: Self-pay | Admitting: Family Medicine

## 2020-03-21 NOTE — Progress Notes (Signed)
  Chronic Care Management   Outreach Note  03/21/2020 Name: Jose Vaughn MRN: [id number] DOB: December 18, 1940  Referred by: [name], MD Reason for referral : No chief complaint on file.   An unsuccessful telephone outreach was attempted today. The patient was referred to the pharmacist for assistance with care management and care coordination.   Follow Up Plan:   Carley Perdue [employer] Scheduler

## 2020-03-29 DIAGNOSIS — G43709 Chronic migraine without aura, not intractable, without status migrainosus: Secondary | ICD-10-CM | POA: Diagnosis not present

## 2020-03-29 DIAGNOSIS — G459 Transient cerebral ischemic attack, unspecified: Secondary | ICD-10-CM | POA: Diagnosis not present

## 2020-03-29 DIAGNOSIS — D352 Benign neoplasm of pituitary gland: Secondary | ICD-10-CM | POA: Diagnosis not present

## 2020-03-29 DIAGNOSIS — D353 Benign neoplasm of craniopharyngeal duct: Secondary | ICD-10-CM | POA: Diagnosis not present

## 2020-03-29 DIAGNOSIS — I679 Cerebrovascular disease, unspecified: Secondary | ICD-10-CM | POA: Diagnosis not present

## 2020-04-04 ENCOUNTER — Telehealth: Payer: Self-pay | Admitting: Family Medicine

## 2020-04-04 NOTE — Progress Notes (Signed)
  Chronic Care Management   Note  04/04/2020 Name: Jose Vaughn MRN: [id number] DOB: 08/28/1940  Jose Vaughn is a 79 y.o. year old male who is a primary care patient of Jose Maw, MD. I reached out to Jose Vaughn by phone today in response to a referral sent by Jose Vaughn's PCP, Jose Maw, MD.   Jose Vaughn was given information about Chronic Care Management services today including:  1. CCM service includes personalized support from designated clinical staff supervised by his physician, including individualized plan of care and coordination with other care providers 2. 24/7 contact phone numbers for assistance for urgent and routine care needs. 3. Service will only be billed when office clinical staff spend 20 minutes or more in a month to coordinate care. 4. Only one practitioner may furnish and bill the service in a calendar month. 5. The patient may stop CCM services at any time (effective at the end of the month) by phone call to the office staff.   Patient wishes to consider information provided and/or speak with a member of the care team before deciding about enrollment in care management services.   Follow up plan:   Carley Perdue [employer] Scheduler

## 2020-04-18 DIAGNOSIS — R0789 Other chest pain: Secondary | ICD-10-CM | POA: Diagnosis not present

## 2020-04-18 DIAGNOSIS — R0602 Shortness of breath: Secondary | ICD-10-CM | POA: Diagnosis not present

## 2020-04-18 DIAGNOSIS — R079 Chest pain, unspecified: Secondary | ICD-10-CM | POA: Diagnosis not present

## 2020-04-18 DIAGNOSIS — R001 Bradycardia, unspecified: Secondary | ICD-10-CM | POA: Diagnosis not present

## 2020-04-28 DIAGNOSIS — I679 Cerebrovascular disease, unspecified: Secondary | ICD-10-CM | POA: Diagnosis not present

## 2020-04-28 DIAGNOSIS — G459 Transient cerebral ischemic attack, unspecified: Secondary | ICD-10-CM | POA: Diagnosis not present

## 2020-05-10 DIAGNOSIS — I679 Cerebrovascular disease, unspecified: Secondary | ICD-10-CM | POA: Diagnosis not present

## 2020-05-10 DIAGNOSIS — I639 Cerebral infarction, unspecified: Secondary | ICD-10-CM | POA: Diagnosis not present

## 2020-05-10 DIAGNOSIS — G459 Transient cerebral ischemic attack, unspecified: Secondary | ICD-10-CM | POA: Diagnosis not present

## 2020-05-10 DIAGNOSIS — I6522 Occlusion and stenosis of left carotid artery: Secondary | ICD-10-CM | POA: Diagnosis not present

## 2020-05-10 DIAGNOSIS — D352 Benign neoplasm of pituitary gland: Secondary | ICD-10-CM | POA: Diagnosis not present

## 2020-05-11 DIAGNOSIS — D352 Benign neoplasm of pituitary gland: Secondary | ICD-10-CM | POA: Diagnosis not present

## 2020-05-11 DIAGNOSIS — H35373 Puckering of macula, bilateral: Secondary | ICD-10-CM | POA: Diagnosis not present

## 2020-05-11 DIAGNOSIS — H02831 Dermatochalasis of right upper eyelid: Secondary | ICD-10-CM | POA: Diagnosis not present

## 2020-05-11 DIAGNOSIS — H02834 Dermatochalasis of left upper eyelid: Secondary | ICD-10-CM | POA: Diagnosis not present

## 2020-05-11 DIAGNOSIS — H25813 Combined forms of age-related cataract, bilateral: Secondary | ICD-10-CM | POA: Diagnosis not present

## 2020-06-13 ENCOUNTER — Encounter: Payer: Self-pay | Admitting: Nurse Practitioner

## 2020-06-13 ENCOUNTER — Other Ambulatory Visit: Payer: Self-pay

## 2020-06-13 ENCOUNTER — Ambulatory Visit (INDEPENDENT_AMBULATORY_CARE_PROVIDER_SITE_OTHER): Payer: Medicare Other | Admitting: Nurse Practitioner

## 2020-06-13 VITALS — BP 178/88 | HR 86 | Temp 97.1°F | Ht 68.0 in | Wt 175.2 lb

## 2020-06-13 DIAGNOSIS — I10 Essential (primary) hypertension: Secondary | ICD-10-CM | POA: Diagnosis not present

## 2020-06-13 DIAGNOSIS — K625 Hemorrhage of anus and rectum: Secondary | ICD-10-CM

## 2020-06-13 DIAGNOSIS — Z23 Encounter for immunization: Secondary | ICD-10-CM | POA: Diagnosis not present

## 2020-06-13 DIAGNOSIS — R634 Abnormal weight loss: Secondary | ICD-10-CM

## 2020-06-13 LAB — BASIC METABOLIC PANEL
BUN: 21 mg/dL (ref 6–23)
CO2: 25 mEq/L (ref 19–32)
Calcium: 8.7 mg/dL (ref 8.4–10.5)
Chloride: 106 mEq/L (ref 96–112)
Creatinine, Ser: 1.34 mg/dL (ref 0.40–1.50)
GFR: 50.74 mL/min — ABNORMAL LOW (ref >60.00)
Glucose, Bld: 83 mg/dL (ref 70–99)
Potassium: 4 mEq/L (ref 3.5–5.1)
Sodium: 141 mEq/L (ref 135–145)

## 2020-06-13 LAB — CBC
HCT: 39.8 % (ref 39.0–52.0)
Hemoglobin: 13.1 g/dL (ref 13.0–17.0)
MCHC: 33 g/dL (ref 30.0–36.0)
MCV: 91.4 fl (ref 78.0–100.0)
Platelets: 190 10*3/uL (ref 150.0–400.0)
RBC: 4.35 Mil/uL (ref 4.22–5.81)
RDW: 13.1 % (ref 11.5–15.5)
WBC: 9.2 10*3/uL (ref 4.0–10.5)

## 2020-06-13 LAB — IFOBT (OCCULT BLOOD): IFOBT: NEGATIVE

## 2020-06-13 LAB — TSH: TSH: 2.43 u[IU]/mL (ref 0.35–4.50)

## 2020-06-13 LAB — FERRITIN: Ferritin: 241.5 ng/mL (ref 22.0–322.0)

## 2020-06-13 MED ORDER — METOPROLOL SUCCINATE ER 25 MG PO TB24: ORAL_TABLET | ORAL | 3 refills | Status: DC

## 2020-06-13 MED ORDER — AMLODIPINE BESYLATE 5 MG PO TABS: ORAL_TABLET | ORAL | 3 refills | Status: DC

## 2020-06-13 NOTE — Patient Instructions (Signed)
Take medications as prescribed  Go to lab for blood draw Call office if you change your mind about referral to GI.  Rectal Bleeding  Rectal bleeding is when blood passes out of the anus. People with rectal bleeding may notice bright red blood in their underwear or in the toilet after having a bowel movement. They may also have dark red or black stools. Rectal bleeding is usually a sign that something is wrong. Many things can cause rectal bleeding, including:  Hemorrhoids. These are blood vessels in the anus or rectum that are larger than normal.  Fistulas. These are abnormal passages in the rectum and anus.  Anal fissures. This is a tear in the anus.  Diverticulosis. This is a condition in which pockets or sacs project from the bowel.  Proctitis and colitis. These are conditions in which the rectum, colon, or anus become inflamed.  Polyps. These are growths that can be cancerous (malignant) or non-cancerous (benign).  Part of the rectum sticking out from the anus (rectal prolapse).  Certain medicines.  Intestinal infections. Follow these instructions at home: Pay attention to any changes in your symptoms. Take these actions to help lessen bleeding and discomfort:  Eat a diet that is high in fiber. This will keep your stool soft, making it easier to pass stools without straining. Ask your health care provider what foods and drinks are high in fiber.  Drink enough fluid to keep your urine clear or pale yellow. This also helps to keep your stool soft.  Try taking a warm bath. This may help soothe any pain in your rectum.  Keep all follow-up visits as told by your health care provider. This is important. Get help right away if:  You have new or increased rectal bleeding.  You have black or dark red stools.  You vomit blood or something that looks like coffee grounds.  You have pain or tenderness in your abdomen.  You have a fever.  You feel weak.  You feel  nauseous.  You faint.  You have severe pain in your rectum.  You cannot have a bowel movement. This information is not intended to replace advice given to you by your health care provider. Make sure you discuss any questions you have with your health care provider. Document Revised: 02/15/2016 Document Reviewed: 08/20/2015 Elsevier Patient Education  2020 [employer].

## 2020-06-13 NOTE — Assessment & Plan Note (Signed)
Elevated BP today Reports he is out of medications. No headache or dizziness. BP Readings from Last 3 Encounters:  06/13/20 (!) 178/88  12/21/19 122/70  09/20/19 138/78   Amlodipine and metoprolol refill sent. With hx of TIA and CVA, I advised about the importance of medications compliance.

## 2020-06-13 NOTE — Progress Notes (Signed)
Subjective:  Patient ID: Jose Vaughn, male    DOB: 1941-04-22  Age: 79 y.o. MRN: [id number]  CC: Acute Visit (Pt c/o bloody stool on Friday of last week. Pt states it was a lot of blood but no clots and it only happened once. Pt also concerned with weight loss x1-2 months. )  Accompanied by wife.  Rectal Bleeding  The current episode started 5 to 7 days ago. The onset was sudden. The problem has been resolved. The patient is experiencing no pain. The stool is described as soft and mixed with blood. There was no prior successful therapy. There was no prior unsuccessful therapy. Pertinent negatives include no anorexia, no fever, no abdominal pain, no diarrhea, no hematemesis, no hemorrhoids, no nausea, no rectal pain, no vomiting and no hematuria. He has been eating and drinking normally. Urine output has been normal. There were no sick contacts.  he is also concerned about gradual weight loss despite adequate food intake. Wt Readings from Last 3 Encounters:  06/13/20 175 lb 3.2 oz (79.5 kg)  12/21/19 173 lb (78.5 kg)  09/20/19 180 lb 1.6 oz (81.7 kg)   Essential hypertension Elevated BP today Reports he is out of medications. No headache or dizziness. BP Readings from Last 3 Encounters:  06/13/20 (!) 178/88  12/21/19 122/70  09/20/19 138/78   Amlodipine and metoprolol refill sent. With hx of TIA and CVA, I advised about the importance of medications compliance.   Reviewed past Medical, Social and Family history today.  Outpatient Medications Prior to Visit  Medication Sig Dispense Refill  . aspirin EC 81 MG tablet Take 81 mg by mouth daily.    [name] atorvastatin (LIPITOR) 80 MG tablet Take 1 tablet (80 mg total) by mouth daily. 90 tablet 4  . imipramine (TOFRANIL) 25 MG tablet Take 25 mg by mouth at bedtime.    . Vitamin D, Ergocalciferol, (DRISDOL) 50000 units CAPS capsule Take 1 capsule by mouth once a week.  5  . amLODipine (NORVASC) 5 MG tablet TAKE 1 TABLET(5 MG) BY MOUTH DAILY  90 tablet 1  . metoprolol succinate (TOPROL-XL) 25 MG 24 hr tablet TAKE 1 TABLET(25 MG) BY MOUTH DAILY 90 tablet 1   No facility-administered medications prior to visit.    ROS See HPI  Objective:  BP (!) 178/88 (BP Location: Left Arm, Cuff Size: Normal)   Pulse 86   Temp (!) 97.1 F (36.2 C) (Temporal)   Ht 5\' 8"  (1.727 m)   Wt 175 lb 3.2 oz (79.5 kg)   SpO2 97%   BMI 26.64 kg/m   Physical Exam Vitals reviewed. Exam conducted with a chaperone present.  Cardiovascular:     Rate and Rhythm: Normal rate.     Pulses: Normal pulses.  Pulmonary:     Effort: Pulmonary effort is normal.  Abdominal:     General: Bowel sounds are normal. There is no distension.     Palpations: Abdomen is soft.     Tenderness: There is no abdominal tenderness.  Genitourinary:    Prostate: Normal.     Rectum: Normal. Guaiac result negative. No tenderness, anal fissure or external hemorrhoid.  Neurological:     Mental Status: He is alert and oriented to person, place, and time.    Assessment & Plan:  This visit occurred during the SARS-CoV-2 public health emergency.  Safety protocols were in place, including screening questions prior to the visit, additional usage of staff PPE, and extensive cleaning of exam room while observing  appropriate contact time as indicated for disinfecting solutions.   Duante was seen today for acute visit.  Diagnoses and all orders for this visit:  Blood per rectum -     CBC -     Ferritin -     IFOBT POC (occult bld, rslt in office)  Influenza vaccine needed -     Flu Vaccine QUAD High Dose(Fluad)  Weight loss, unintentional -     TSH -     Basic metabolic panel  Essential hypertension -     amLODipine (NORVASC) 5 MG tablet; TAKE 1 TABLET(5 MG) BY MOUTH DAILY -     metoprolol succinate (TOPROL-XL) 25 MG 24 hr tablet; TAKE 1 TABLET(25 MG) BY MOUTH DAILY  Take medications as prescribed Normal CBC, BMP, ferritin, TSH and iFOB. He declined referral to GI at  this time Advised to call office if he changes his mind about referral to GI.  Problem List Items Addressed This Visit      Cardiovascular and Mediastinum   Essential hypertension    Elevated BP today Reports he is out of medications. No headache or dizziness. BP Readings from Last 3 Encounters:  06/13/20 (!) 178/88  12/21/19 122/70  09/20/19 138/78   Amlodipine and metoprolol refill sent. With hx of TIA and CVA, I advised about the importance of medications compliance.       Relevant Medications   amLODipine (NORVASC) 5 MG tablet   metoprolol succinate (TOPROL-XL) 25 MG 24 hr tablet    Other Visit Diagnoses    Blood per rectum    -  Primary   Relevant Orders   CBC (Completed)   Ferritin (Completed)   IFOBT POC (occult bld, rslt in office) (Completed)   Influenza vaccine needed       Relevant Orders   Flu Vaccine QUAD High Dose(Fluad) (Completed)   Weight loss, unintentional       Relevant Orders   TSH (Completed)   Basic metabolic panel (Completed)      Follow-up: No follow-ups on file.  Wilfred Lacy, NP

## 2020-08-10 DIAGNOSIS — D353 Benign neoplasm of craniopharyngeal duct: Secondary | ICD-10-CM | POA: Diagnosis not present

## 2020-08-10 DIAGNOSIS — G43719 Chronic migraine without aura, intractable, without status migrainosus: Secondary | ICD-10-CM | POA: Diagnosis not present

## 2020-08-10 DIAGNOSIS — D352 Benign neoplasm of pituitary gland: Secondary | ICD-10-CM | POA: Diagnosis not present

## 2020-08-10 DIAGNOSIS — G459 Transient cerebral ischemic attack, unspecified: Secondary | ICD-10-CM | POA: Diagnosis not present

## 2020-09-19 ENCOUNTER — Telehealth: Payer: Self-pay | Admitting: Family Medicine

## 2020-09-19 DIAGNOSIS — E789 Disorder of lipoprotein metabolism, unspecified: Secondary | ICD-10-CM

## 2020-09-20 ENCOUNTER — Telehealth: Payer: Self-pay

## 2020-09-20 ENCOUNTER — Other Ambulatory Visit: Payer: Self-pay

## 2020-09-20 DIAGNOSIS — E789 Disorder of lipoprotein metabolism, unspecified: Secondary | ICD-10-CM

## 2020-09-20 MED ORDER — ATORVASTATIN CALCIUM 80 MG PO TABS: 80.0000 mg | ORAL_TABLET | Freq: Every day | ORAL | 4 refills | Status: DC

## 2020-09-20 NOTE — Telephone Encounter (Signed)
Pt is requesting a refill for atorvastatin (LIPITOR) 80 MG tablet   He is completely of medication. Send to [facility]    Thank you

## 2020-09-20 NOTE — Telephone Encounter (Signed)
Refill sent in patient aware  

## 2020-09-20 NOTE — Telephone Encounter (Signed)
Patient refused to schedule an appointment. States that he will do something different and disconnected the call.

## 2020-10-03 ENCOUNTER — Ambulatory Visit: Payer: Medicare Other

## 2020-10-03 DIAGNOSIS — R519 Headache, unspecified: Secondary | ICD-10-CM | POA: Diagnosis not present

## 2020-10-03 DIAGNOSIS — D352 Benign neoplasm of pituitary gland: Secondary | ICD-10-CM | POA: Diagnosis not present

## 2020-10-03 NOTE — Progress Notes (Deleted)
Subjective:   Jose Vaughn is a 80 y.o. male who presents for an Initial Medicare Annual Wellness Visit.  I connected with *** today by telephone and verified that I am speaking with the correct person using two identifiers. Location patient: home Location provider: work Persons participating in the virtual visit: patient, Marine scientist.    I discussed the limitations, risks, security and privacy concerns of performing an evaluation and management service by telephone and the availability of in person appointments. I also discussed with the patient that there may be a patient responsible charge related to this service. The patient expressed understanding and verbally consented to this telephonic visit.    Interactive audio and video telecommunications were attempted between this provider and patient, however failed, due to patient having technical difficulties OR patient did not have access to video capability.  We continued and completed visit with audio only.  Some vital signs may be absent or patient reported.   Time Spent with patient on telephone encounter: *** minutes   Review of Systems    ***       Objective:    There were no vitals filed for this visit. There is no height or weight on file to calculate BMI.  No flowsheet data found.  Current Medications (verified) Outpatient Encounter Medications as of 10/03/2020  Medication Sig  . amLODipine (NORVASC) 5 MG tablet TAKE 1 TABLET(5 MG) BY MOUTH DAILY  . aspirin EC 81 MG tablet Take 81 mg by mouth daily.  [name] atorvastatin (LIPITOR) 80 MG tablet Take 1 tablet (80 mg total) by mouth daily.  [name] imipramine (TOFRANIL) 25 MG tablet Take 25 mg by mouth at bedtime.  . metoprolol succinate (TOPROL-XL) 25 MG 24 hr tablet TAKE 1 TABLET(25 MG) BY MOUTH DAILY  . Vitamin D, Ergocalciferol, (DRISDOL) 50000 units CAPS capsule Take 1 capsule by mouth once a week.   No facility-administered encounter medications on file as of 10/03/2020.     Allergies (verified) Topiramate   History: Past Medical History:  Diagnosis Date  . Frequent headaches   . Hyperlipidemia   . Hypertension   . Stroke [facility])    No past surgical history on file. Family History  Problem Relation Age of Onset  . Heart attack Mother   . Other Father        unsure  . Ovarian cancer Sister    Social History   Socioeconomic History  . Marital status: Married    Spouse name: Not on file  . Number of children: 3  . Years of education: 10th grade  . Highest education level: Not on file  Occupational History  . Occupation: Retired  Tobacco Use  . Smoking status: Former Smoker    Types: Cigarettes  . Smokeless tobacco: Never Used  . Tobacco comment: 09/23/17 - quit 40+ years ago  Vaping Use  . Vaping Use: Never used  Substance and Sexual Activity  . Alcohol use: No  . Drug use: No  . Sexual activity: Not on file  Other Topics Concern  . Not on file  Social History Narrative   Lives at home with his wife.   1 cup caffeine per day.   Left-handed.   Social Determinants of Health   Financial Resource Strain: Not on file  Food Insecurity: Not on file  Transportation Needs: Not on file  Physical Activity: Not on file  Stress: Not on file  Social Connections: Not on file    Tobacco Counseling Counseling given: Not Answered  Comment: 09/23/17 - quit 40+ years ago   Clinical Intake:                 Diabetic?No         Activities of Daily Living No flowsheet data found.  Patient Care Team: [name], MD as PCP - General (Family Medicine)  Indicate any recent Medical Services you may have received from other than [facility] providers in the past year (date may be approximate).     Assessment:   This is a routine wellness examination for [name].  Hearing/Vision screen No exam data present  Dietary issues and exercise activities discussed:    Goals   None    Depression Screen PHQ 2/9 Scores  12/21/2019 06/17/2019 07/28/2017  PHQ - 2 Score 0 0 2  PHQ- 9 Score - - 4    Fall Risk Fall Risk  12/21/2019 09/20/2019 06/17/2019  Falls in the past year? 0 0 0    FALL RISK PREVENTION PERTAINING TO THE HOME:  Any stairs in or around the home? {YES/NO:21197} If so, are there any without handrails? {YES/NO:21197} Home free of loose throw rugs in walkways, pet beds, electrical cords, etc? {YES/NO:21197} Adequate lighting in your home to reduce risk of falls? {YES/NO:21197}  ASSISTIVE DEVICES UTILIZED TO PREVENT FALLS:  Life alert? {YES/NO:21197} Use of a cane, walker or w/c? {YES/NO:21197} Grab bars in the bathroom? {YES/NO:21197} Shower chair or bench in shower? {YES/NO:21197} Elevated toilet seat or a handicapped toilet? {YES/NO:21197}  TIMED UP AND GO:  Was the test performed? {YES/NO:21197}.  Length of time to ambulate 10 feet: *** sec.   {Appearance of WLNL:8921194}  Cognitive Function:        Immunizations Immunization History  Administered Date(s) Administered  . Fluad Quad(high Dose 65+) 06/17/2019, 06/13/2020  . Influenza, High Dose Seasonal PF 07/28/2017  . Pneumococcal Conjugate-13 09/26/2014    TDAP status: Due, Education has been provided regarding the importance of this vaccine. Advised may receive this vaccine at local pharmacy or Health Dept. Aware to provide a copy of the vaccination record if obtained from local pharmacy or Health Dept. Verbalized acceptance and understanding.  Flu Vaccine status: Up to date  Pneumococcal vaccine status: Due, Education has been provided regarding the importance of this vaccine. Advised may receive this vaccine at local pharmacy or Health Dept. Aware to provide a copy of the vaccination record if obtained from local pharmacy or Health Dept. Verbalized acceptance and understanding.  {Covid-19 vaccine status:2101808}  Qualifies for Shingles Vaccine? Yes   Zostavax completed No   Shingrix Completed?: No.    Education has  been provided regarding the importance of this vaccine. Patient has been advised to call insurance company to determine out of pocket expense if they have not yet received this vaccine. Advised may also receive vaccine at local pharmacy or Health Dept. Verbalized acceptance and understanding.  Screening Tests Health Maintenance  Topic Date Due  . Hepatitis C Screening  Never done  . COVID-19 Vaccine (1) Never done  . TETANUS/TDAP  Never done  . PNA vac Low Risk Adult (2 of 2 - PPSV23) 09/26/2015  . INFLUENZA VACCINE  Completed  . HPV VACCINES  Aged Out    Health Maintenance  Health Maintenance Due  Topic Date Due  . Hepatitis C Screening  Never done  . COVID-19 Vaccine (1) Never done  . TETANUS/TDAP  Never done  . PNA vac Low Risk Adult (2 of 2 - PPSV23) 09/26/2015    Colorectal  cancer screening: No longer required.   Lung Cancer Screening: (Low Dose CT Chest recommended if Age 48-80 years, 30 pack-year currently smoking OR have quit w/in 15years.) does not qualify.    Additional Screening:  Hepatitis C Screening: does not qualify  Vision Screening: Recommended annual ophthalmology exams for early detection of glaucoma and other disorders of the eye. Is the patient up to date with their annual eye exam?  {YES/NO:21197} Who is the provider or what is the name of the office in which the patient attends annual eye exams? *** If pt is not established with a provider, would they like to be referred to a provider to establish care? {YES/NO:21197}.   Dental Screening: Recommended annual dental exams for proper oral hygiene  Community Resource Referral / Chronic Care Management: CRR required this visit?  {YES/NO:21197}  CCM required this visit?  {YES/NO:21197}     Plan:     I have personally reviewed and noted the following in the patient's chart:   . Medical and social history . Use of alcohol, tobacco or illicit drugs  . Current medications and supplements . Functional  ability and status . Nutritional status . Physical activity . Advanced directives . List of other physicians . Hospitalizations, surgeries, and ER visits in previous 12 months . Vitals . Screenings to include cognitive, depression, and falls . Referrals and appointments  In addition, I have reviewed and discussed with patient certain preventive protocols, quality metrics, and best practice recommendations. A written personalized care plan for preventive services as well as general preventive health recommendations were provided to patient.    Due to this being a telephonic visit, the after visit summary with patients personalized plan was offered to patient via mail or my-chart. ***Patient declined at this time./ Patient would like to access on my-chart/ per request, patient was mailed a copy of AVS./ Patient preferred to pick up at office at next visit.    Marta Antu, LPN   [id number]  Nurse Health Advisor  Nurse Notes: ***

## 2020-10-19 DIAGNOSIS — D352 Benign neoplasm of pituitary gland: Secondary | ICD-10-CM | POA: Diagnosis not present

## 2020-11-09 DIAGNOSIS — H02831 Dermatochalasis of right upper eyelid: Secondary | ICD-10-CM | POA: Diagnosis not present

## 2020-11-09 DIAGNOSIS — H25813 Combined forms of age-related cataract, bilateral: Secondary | ICD-10-CM | POA: Diagnosis not present

## 2020-11-09 DIAGNOSIS — H35373 Puckering of macula, bilateral: Secondary | ICD-10-CM | POA: Diagnosis not present

## 2020-11-09 DIAGNOSIS — D352 Benign neoplasm of pituitary gland: Secondary | ICD-10-CM | POA: Diagnosis not present

## 2020-11-09 DIAGNOSIS — H02834 Dermatochalasis of left upper eyelid: Secondary | ICD-10-CM | POA: Diagnosis not present

## 2020-12-06 DIAGNOSIS — H25812 Combined forms of age-related cataract, left eye: Secondary | ICD-10-CM | POA: Diagnosis not present

## 2020-12-06 DIAGNOSIS — H25813 Combined forms of age-related cataract, bilateral: Secondary | ICD-10-CM | POA: Diagnosis not present

## 2020-12-19 DIAGNOSIS — H25812 Combined forms of age-related cataract, left eye: Secondary | ICD-10-CM | POA: Diagnosis not present

## 2020-12-19 DIAGNOSIS — H268 Other specified cataract: Secondary | ICD-10-CM | POA: Diagnosis not present

## 2020-12-20 ENCOUNTER — Telehealth: Payer: Self-pay | Admitting: Family Medicine

## 2020-12-20 NOTE — Telephone Encounter (Signed)
I spoke to patient and he was leaving to go to the eye doctor.  Patient will call back and schedule Medicare Annual Wellness Visit (AWV).   Please offer to do virtually or by telephone.   Due for AWVI  Please schedule at anytime with Nurse Health Advisor.

## 2020-12-28 DIAGNOSIS — H25811 Combined forms of age-related cataract, right eye: Secondary | ICD-10-CM | POA: Diagnosis not present

## 2021-01-16 DIAGNOSIS — H268 Other specified cataract: Secondary | ICD-10-CM | POA: Diagnosis not present

## 2021-01-16 DIAGNOSIS — H25811 Combined forms of age-related cataract, right eye: Secondary | ICD-10-CM | POA: Diagnosis not present

## 2021-03-21 DIAGNOSIS — Z961 Presence of intraocular lens: Secondary | ICD-10-CM | POA: Diagnosis not present

## 2021-04-05 ENCOUNTER — Telehealth: Payer: Self-pay | Admitting: Family Medicine

## 2021-04-05 NOTE — Progress Notes (Signed)
  Chronic Care Management   Note  04/05/2021 Name: Jose Vaughn MRN: [id number] DOB: February 01, 1941  Jose Vaughn is a 80 y.o. year old male who is a primary care patient of [name], MD. I reached out to Jose Vaughn by phone today in response to a referral sent by Jose Vaughn's PCP, [name], MD.   Jose Vaughn was given information about Chronic Care Management services today including:  CCM service includes personalized support from designated clinical staff supervised by his physician, including individualized plan of care and coordination with other care providers 24/7 contact phone numbers for assistance for urgent and routine care needs. Service will only be billed when office clinical staff spend 20 minutes or more in a month to coordinate care. Only one practitioner may furnish and bill the service in a calendar month. The patient may stop CCM services at any time (effective at the end of the month) by phone call to the office staff.   Patient agreed to services and verbal consent obtained.   Follow up plan:   Jose Vaughn

## 2021-04-09 ENCOUNTER — Telehealth: Payer: Self-pay

## 2021-04-09 ENCOUNTER — Ambulatory Visit (INDEPENDENT_AMBULATORY_CARE_PROVIDER_SITE_OTHER): Payer: Medicare Other

## 2021-04-09 DIAGNOSIS — I10 Essential (primary) hypertension: Secondary | ICD-10-CM

## 2021-04-09 DIAGNOSIS — E789 Disorder of lipoprotein metabolism, unspecified: Secondary | ICD-10-CM

## 2021-04-09 NOTE — Progress Notes (Signed)
    Chronic Care Management Pharmacy Assistant   Name: Jose Vaughn  MRN: [id number] DOB: [birth year]  Chart Review for clinical pharmacist on 05/06/2021.  Conditions to be addressed/monitored: HTN, HLD, Anxiety, and Atherosclerosis of both carotid arteries,Chronic tension- type headache, Anemia, Vitamin D deficiency.   Primary concerns for visit include: None   Recent office visits:  No recent Office Visit  Recent consult visits:  11/09/2020 Gala Romney MD (Ophthalmology) (Unable to see note) 10/19/2020 [facility] (unable to see note)  Hospital visits:  None in previous 6 months  Medications: Outpatient Encounter Medications as of 04/09/2021  Medication Sig   amLODipine (NORVASC) 5 MG tablet TAKE 1 TABLET(5 MG) BY MOUTH DAILY   aspirin EC 81 MG tablet Take 81 mg by mouth daily.   atorvastatin (LIPITOR) 80 MG tablet Take 1 tablet (80 mg total) by mouth daily.   imipramine (TOFRANIL) 25 MG tablet Take 25 mg by mouth at bedtime.   metoprolol succinate (TOPROL-XL) 25 MG 24 hr tablet TAKE 1 TABLET(25 MG) BY MOUTH DAILY   Vitamin D, Ergocalciferol, (DRISDOL) 50000 units CAPS capsule Take 1 capsule by mouth once a week.   No facility-administered encounter medications on file as of 04/09/2021.    Care Gaps: COVID-19 Vaccine  Hepatitis C Screening Tetanus Vaccine Shingrix Vaccine Influenza Vaccine HTN was 178/88 on 06/13/2020 [facility]: Atorvastatin 80 mg last filled on 02/14/2021 for 90 day supply at [facility]. Medication Fill Gaps: Imipramine 25 MG last filled 03/23/2021 90 day supply  [place] Pharmacist Assistant [phone]

## 2021-04-09 NOTE — Patient Instructions (Signed)
Visit Information It was great speaking with you today!  Please let me know if you have any questions about our visit.   Goals Addressed             This Visit's Progress    Track and Manage My Blood Pressure-Hypertension       Timeframe:  Long-Range Goal Priority:  High Start Date: 04/09/2021                             Expected End Date: 04/09/2022                      Follow Up within 90 days   - check blood pressure weekly    Why is this important?   You won't feel high blood pressure, but it can still hurt your blood vessels.  High blood pressure can cause heart or kidney problems. It can also cause a stroke.  Making lifestyle changes like losing a little weight or eating less salt will help.  Checking your blood pressure at home and at different times of the day can help to control blood pressure.  If the doctor prescribes medicine remember to take it the way the doctor ordered.  Call the office if you cannot afford the medicine or if there are questions about it.     Notes:         Patient Care Plan: General Pharmacy (Adult)     Problem Identified: Hypertension, Hyperlipidemia, Coronary Artery Disease, Anxiety, and Chronic Headaches   Priority: High     Long-Range Goal: Patient-Specific Goal   Start Date: 04/09/2021  Expected End Date: 04/09/2022  This Visit's Progress: On track  Priority: High  Note:   Current Barriers:  Unable to achieve control of cholesterol   Pharmacist Clinical Goal(s):  Patient will achieve control of cholesterol as evidenced by LDL less than 70 maintain control of blood pressure as evidenced by BP less than 140/90  through collaboration with PharmD and provider.   Interventions: 1:1 collaboration with [name], MD regarding development and update of comprehensive plan of care as evidenced by provider attestation and co-signature Inter-disciplinary care team collaboration (see longitudinal plan of care) Comprehensive  medication review performed; medication list updated in electronic medical record  Hypertension (BP goal <140/90) -Controlled -Current treatment: Amlodipine 5 mg daily  Metoprolol XL 25 mg daily  -Medications previously tried: NA  -Current home readings: Did not have monitor readings on hand.  -Current dietary habits: Patient wife handles all home cooking. Drinks coffee a few times weekly.   -Current exercise habits: Walking, working on cars, yard work.  -Reports hypotensive symptoms: Does report dizziness, but patient wife thinks it may be related to his headaches. Still has dizziness spells, but reports they are less frequent and severe. -Recommended to continue current medication  Hyperlipidemia: (LDL goal < 70) -Uncontrolled -History of TIA 2017 -Current treatment: Atorvastatin 80 mg daily  Aspirin 81 mg daily  -Medications previously tried: NA  -Educated on Importance of limiting foods high in cholesterol; -Recommended rechecking cholesterol and starting ezetimibe 10 mg daily if LDL remains greater than 70   Patient Goals/Self-Care Activities Patient will:  - check blood pressure weekly, document, and provide at future appointments  Follow Up Plan: Telephone follow up appointment with care management team member scheduled for:  04/04/2022 at 3:00 PM    Jose Vaughn was given information about Chronic Care Management services  today including:  CCM service includes personalized support from designated clinical staff supervised by his physician, including individualized plan of care and coordination with other care providers 24/7 contact phone numbers for assistance for urgent and routine care needs. Standard insurance, coinsurance, copays and deductibles apply for chronic care management only during months in which we provide at least 20 minutes of these services. Most insurances cover these services at 100%, however patients may be responsible for any copay, coinsurance and/or  deductible if applicable. This service may help you avoid the need for more expensive face-to-face services. Only one practitioner may furnish and bill the service in a calendar month. The patient may stop CCM services at any time (effective at the end of the month) by phone call to the office staff.  Patient agreed to services and verbal consent obtained.   Print copy of patient instructions, educational materials, and care plan provided in person.  Jose Vaughn, PharmD, Para [place], CPP Clinical Pharmacist [facility]  [phone]

## 2021-04-09 NOTE — Progress Notes (Signed)
Chronic Care Management Pharmacy Note  04/09/2021 Name:  Jose Vaughn MRN:  [id number] DOB:  April 29, 1941  Summary: Patient presents for initial CCM consult. Today's visit was 50% conducted with the patient and 50% with his wife, Arbie Cookey. He continues to have dizziness but feels it is improved overall. He is trying to stay active and eat healthy. He is overdue for a physical with his PCP.  Recommendations/Changes made from today's visit: Recommended rechecking cholesterol and starting ezetimibe 10 mg daily if LDL remains greater than 70.  Plan: PCP follow-up scheduled for 05/28/21 CPP follow-up 1 year  Recommended Problem List Changes:  Add: History of TIA  Modify:  Elevated Serum Cholesterol to more appropriate code based on updated cholesterol panel   Subjective: Jose Vaughn is an 80 y.o. year old male who is a primary patient of Ethelene Hal, Mortimer Fries, MD.  The CCM team was consulted for assistance with disease management and care coordination needs.    Engaged with patient by telephone for initial visit in response to provider referral for pharmacy case management and/or care coordination services.   Consent to Services:  The patient was given the following information about Chronic Care Management services today, agreed to services, and gave verbal consent: 1. CCM service includes personalized support from designated clinical staff supervised by the primary care provider, including individualized plan of care and coordination with other care providers 2. 24/7 contact phone numbers for assistance for urgent and routine care needs. 3. Service will only be billed when office clinical staff spend 20 minutes or more in a month to coordinate care. 4. Only one practitioner may furnish and bill the service in a calendar month. 5.The patient may stop CCM services at any time (effective at the end of the month) by phone call to the office staff. 6. The patient will be responsible for cost sharing  (co-pay) of up to 20% of the service fee (after annual deductible is met). Patient agreed to services and consent obtained.  Patient Care Team: [name], MD as PCP - General (Family Medicine) Germaine Pomfret, [facility] as Pharmacist (Pharmacist)  Recent office visits: 06/13/20: Patient presented to Wilfred Lacy, NP for hematuria.  12/21/19: Patient presented to Dr. Ethelene Hal for follow-up.   Recent consult visits: 11/09/2020 Gala Romney MD (Ophthalmology) (Unable to see note) 10/19/2020 [facility] (unable to see note)  Hospital visits: None in previous 6 months   Objective:  Lab Results  Component Value Date   CREATININE 1.34 06/13/2020   BUN 21 06/13/2020   GFR 50.74 (L) 06/13/2020   GFRNONAA 55 (L) 12/29/2017   GFRAA 63 12/29/2017   NA 141 06/13/2020   K 4.0 06/13/2020   CALCIUM 8.7 06/13/2020   CO2 25 06/13/2020   GLUCOSE 83 06/13/2020    Lab Results  Component Value Date/Time   GFR 50.74 (L) 06/13/2020 10:12 AM   GFR 64.64 09/20/2019 03:38 PM    Last diabetic Eye exam: No results found for: HMDIABEYEEXA  Last diabetic Foot exam: No results found for: HMDIABFOOTEX   Lab Results  Component Value Date   CHOL 284 (H) 06/17/2019   HDL 34.90 (L) 06/17/2019   LDLCALC 210 (H) 06/17/2019   LDLDIRECT 86.0 09/20/2019   TRIG 193.0 (H) 06/17/2019   CHOLHDL 8 06/17/2019    Hepatic Function Latest Ref Rng & Units 09/20/2019 06/17/2019 04/01/2018  Total Protein 6.0 - 8.3 g/dL 6.5 7.1 7.5  Albumin 3.5 - 5.2 g/dL 3.6 4.1 4.2  AST 0 - 37 U/L 21 20 25   ALT 0 - 53 U/L 13 12 16   Alk Phosphatase 39 - 117 U/L 79 85 93  Total Bilirubin 0.2 - 1.2 mg/dL 0.6 0.8 0.9    Lab Results  Component Value Date/Time   TSH 2.43 06/13/2020 10:12 AM   TSH 1.760 12/29/2017 01:47 PM    CBC Latest Ref Rng & Units 06/13/2020 12/21/2019 09/20/2019  WBC 4.0 - 10.5 K/uL 9.2 8.1 7.2  Hemoglobin 13.0 - 17.0 g/dL 13.1 12.2(L) 12.1(L)  Hematocrit 39.0 - 52.0 %  39.8 36.2(L) 35.9(L)  Platelets 150.0 - 400.0 K/uL 190.0 184.0 162.0    Lab Results  Component Value Date/Time   VD25OH 31.23 09/20/2019 03:38 PM    Clinical ASCVD: Yes  The ASCVD Risk score (Arnett DK, et al., 2019) failed to calculate for the following reasons:   The patient has a prior MI or stroke diagnosis    Depression screen [facility] 2/9 12/21/2019 06/17/2019 07/28/2017  Decreased Interest 0 0 2  Down, Depressed, Hopeless 0 0 0  PHQ - 2 Score 0 0 2  Altered sleeping - - 0  Tired, decreased energy - - 2  Change in appetite - - 0  Feeling bad or failure about yourself  - - 0  Trouble concentrating - - 0  Moving slowly or fidgety/restless - - 0  Suicidal thoughts - - 0  PHQ-9 Score - - 4    Social History   Tobacco Use  Smoking Status Former   Types: Cigarettes  Smokeless Tobacco Never  Tobacco Comments   09/23/17 - quit 40+ years ago   BP Readings from Last 3 Encounters:  06/13/20 (!) 178/88  12/21/19 122/70  09/20/19 138/78   Pulse Readings from Last 3 Encounters:  06/13/20 86  12/21/19 77  09/20/19 (!) 57   Wt Readings from Last 3 Encounters:  06/13/20 175 lb 3.2 oz (79.5 kg)  12/21/19 173 lb (78.5 kg)  09/20/19 180 lb 1.6 oz (81.7 kg)   BMI Readings from Last 3 Encounters:  06/13/20 26.64 kg/m  12/21/19 26.30 kg/m  09/20/19 27.38 kg/m    Assessment/Interventions: Review of patient past medical history, allergies, medications, health status, including review of consultants reports, laboratory and other test data, was performed as part of comprehensive evaluation and provision of chronic care management services.   SDOH:  (Social Determinants of Health) assessments and interventions performed: Yes SDOH Interventions    Flowsheet Row Most Recent Value  SDOH Interventions   Financial Strain Interventions Intervention Not Indicated      SDOH Screenings   Alcohol Screen: Not on file  Depression (PHQ2-9): Not on file  Financial Resource Strain: Low  Risk    Difficulty of Paying Living Expenses: Not hard at all  Food Insecurity: Not on file  Housing: Not on file  Physical Activity: Not on file  Social Connections: Not on file  Stress: Not on file  Tobacco Use: Medium Risk   Smoking Tobacco Use: Former   Smokeless Tobacco Use: Never  Transportation Needs: Not on file    [place]  Allergies  Allergen Reactions   Topiramate Other (See Comments)    Fatigue, nausea and weakness    Medications Reviewed Today     Reviewed by Flossie Buffy, NP (Nurse Practitioner) on 06/13/20 at [place] List Status: <None>   Medication Order Taking? Sig Documenting Provider Last Dose Status Informant  amLODipine (NORVASC) 5 MG tablet [id number] Yes TAKE 1  TABLET(5 MG) BY MOUTH DAILY Nche, Charlene Brooke, NP  Active   aspirin EC 81 MG tablet [id number] Yes Take 81 mg by mouth daily. [provider] Taking Active   atorvastatin (LIPITOR) 80 MG tablet [id number] Yes Take 1 tablet (80 mg total) by mouth daily. [name], MD Taking Active   imipramine (TOFRANIL) 25 MG tablet [id number] Yes Take 25 mg by mouth at bedtime. [provider] Taking Active   metoprolol succinate (TOPROL-XL) 25 MG 24 hr tablet [id number] Yes TAKE 1 TABLET(25 MG) BY MOUTH DAILY Nche, Charlene Brooke, NP  Active   Vitamin D, Ergocalciferol, (DRISDOL) 50000 units CAPS capsule [id number] Yes Take 1 capsule by mouth once a week. [provider] Taking Active             Patient Active Problem List   Diagnosis Date Noted   Vitamin D deficiency 09/20/2019   Hematuria 06/17/2019   Chronic nonintractable headache 06/22/2018   Anemia 04/01/2018   Atherosclerosis of both carotid arteries 12/29/2017   Intractable headache 09/23/2017   Need for influenza vaccination 07/28/2017   Essential hypertension 07/28/2017   Elevated serum cholesterol 07/28/2017   Pituitary adenoma ([place]) 07/28/2017   Anxiety 07/28/2017   History of  noncompliance with medical treatment 07/28/2017   Chronic tension-type headache, not intractable 07/28/2017   Screen for colon cancer 07/28/2017    Immunization History  Administered Date(s) Administered   Fluad Quad(high Dose 65+) 06/17/2019, 06/13/2020   Influenza, High Dose Seasonal PF 07/28/2017   Pneumococcal Conjugate-13 09/26/2014    Conditions to be addressed/monitored:  Hypertension, Hyperlipidemia, Coronary Artery Disease, Anxiety, and Chronic Headaches  Care Plan : General Pharmacy (Adult)  Updates made by Germaine Pomfret, RPH since 04/09/2021 12:00 AM     Problem: Hypertension, Hyperlipidemia, Coronary Artery Disease, Anxiety, and Chronic Headaches   Priority: High     Long-Range Goal: Patient-Specific Goal   Start Date: 04/09/2021  Expected End Date: 04/09/2022  This Visit's Progress: On track  Priority: High  Note:   Current Barriers:  Unable to achieve control of cholesterol   Pharmacist Clinical Goal(s):  Patient will achieve control of cholesterol as evidenced by LDL less than 70 maintain control of blood pressure as evidenced by BP less than 140/90  through collaboration with PharmD and provider.   Interventions: 1:1 collaboration with [name], MD regarding development and update of comprehensive plan of care as evidenced by provider attestation and co-signature Inter-disciplinary care team collaboration (see longitudinal plan of care) Comprehensive medication review performed; medication list updated in electronic medical record  Hypertension (BP goal <140/90) -Controlled -Current treatment: Amlodipine 5 mg daily  Metoprolol XL 25 mg daily  -Medications previously tried: NA  -Current home readings: Did not have monitor readings on hand.  -Current dietary habits: Patient wife handles all home cooking. Drinks coffee a few times weekly.   -Current exercise habits: Walking, working on cars, yard work.  -Reports hypotensive symptoms:  Does report dizziness, but patient wife thinks it may be related to his headaches. Still has dizziness spells, but reports they are less frequent and severe. -Recommended to continue current medication  Hyperlipidemia: (LDL goal < 70) -Uncontrolled -History of TIA 2017 -Current treatment: Atorvastatin 80 mg daily  Aspirin 81 mg daily  -Medications previously tried: NA  -Educated on Importance of limiting foods high in cholesterol; -Recommended rechecking cholesterol and starting ezetimibe 10 mg daily if LDL remains greater than 70   Patient Goals/Self-Care Activities Patient will:  - check  blood pressure weekly, document, and provide at future appointments  Follow Up Plan: Telephone follow up appointment with care management team member scheduled for:  04/04/2022 at 3:00 PM      Medication Assistance: None required.  Patient affirms current coverage meets needs.  Compliance/Adherence/Medication fill history: Care Gaps: COVID-19 Vaccine  Hepatitis C Screening Tetanus Vaccine Shingrix Vaccine Influenza Vaccine HTN was 178/88 on 06/13/2020  [facility]: Atorvastatin 80 mg last filled on 02/14/2021 for 90 day supply at [facility]  Patient's preferred pharmacy is:  [facility] [place], [address] [place] [address] Phone: [phone] Fax: [phone]  Uses pill box? Yes Pt endorses 100% compliance  We discussed: Current pharmacy is preferred with insurance plan and patient is satisfied with pharmacy services Patient decided to: Continue current medication management strategy  Care Plan and Follow Up Patient Decision:  Patient agrees to Care Plan and Follow-up.  Plan: Telephone follow up appointment with care management team member scheduled for:  04/04/2022 at 3:00 PM  Jose Argyle, PharmD, Para March, CPP Clinical Pharmacist [facility]  [phone]

## 2021-05-07 DIAGNOSIS — I10 Essential (primary) hypertension: Secondary | ICD-10-CM | POA: Diagnosis not present

## 2021-05-28 ENCOUNTER — Ambulatory Visit (INDEPENDENT_AMBULATORY_CARE_PROVIDER_SITE_OTHER): Payer: Medicare Other | Admitting: Family Medicine

## 2021-05-28 ENCOUNTER — Ambulatory Visit (INDEPENDENT_AMBULATORY_CARE_PROVIDER_SITE_OTHER): Payer: Medicare Other

## 2021-05-28 ENCOUNTER — Encounter: Payer: Self-pay | Admitting: Family Medicine

## 2021-05-28 ENCOUNTER — Other Ambulatory Visit: Payer: Self-pay

## 2021-05-28 VITALS — BP 140/70 | HR 62 | Temp 97.3°F | Ht 68.0 in | Wt 167.0 lb

## 2021-05-28 DIAGNOSIS — R634 Abnormal weight loss: Secondary | ICD-10-CM | POA: Diagnosis not present

## 2021-05-28 DIAGNOSIS — Z8679 Personal history of other diseases of the circulatory system: Secondary | ICD-10-CM | POA: Diagnosis not present

## 2021-05-28 DIAGNOSIS — Z23 Encounter for immunization: Secondary | ICD-10-CM

## 2021-05-28 DIAGNOSIS — I10 Essential (primary) hypertension: Secondary | ICD-10-CM | POA: Diagnosis not present

## 2021-05-28 DIAGNOSIS — R0602 Shortness of breath: Secondary | ICD-10-CM

## 2021-05-28 DIAGNOSIS — Z125 Encounter for screening for malignant neoplasm of prostate: Secondary | ICD-10-CM

## 2021-05-28 DIAGNOSIS — R319 Hematuria, unspecified: Secondary | ICD-10-CM

## 2021-05-28 DIAGNOSIS — E789 Disorder of lipoprotein metabolism, unspecified: Secondary | ICD-10-CM

## 2021-05-28 NOTE — Progress Notes (Addendum)
Jose Vaughn  Established Patient Office Visit  Subjective:  Patient ID: Jose Vaughn, male    DOB: 1941-01-26  Age: 80 y.o. MRN: [id number]  CC:  Chief Complaint  Patient presents with   Annual Exam    CPE, no concerns. Patient not fasting.     HPI Jose Vaughn presents for a physical exam.  He is nonfasting today.  He reports weight loss.  This has been an intermittent issue over the years.  Denies any fevers chills night sweats.  Headaches are doing better.  He does admit some shortness of breath.  He does not necessarily correlate it with physical activity.  He denies chest pain nausea diaphoresis.  He does not exercise.  He quit smoking 15 years ago.  Denies cough.  Denies blood in his stool or urine.  Denies hematochezia.  Appetite is good.  Denies constipation but says that stools are loose sometimes.  Denies abdominal pain.  Past Medical History:  Diagnosis Date   Frequent headaches    Hyperlipidemia    Hypertension    Stroke [facility])     History reviewed. No pertinent surgical history.  Family History  Problem Relation Age of Onset   Heart attack Mother    Other Father        unsure   Ovarian cancer Sister     Social History   Socioeconomic History   Marital status: Married    Spouse name: Not on file   Number of children: 3   Years of education: 10th grade   Highest education level: Not on file  Occupational History   Occupation: Retired  Tobacco Use   Smoking status: Former    Types: Cigarettes   Smokeless tobacco: Never   Tobacco comments:    09/23/17 - quit 40+ years ago  Vaping Use   Vaping Use: Never used  Substance and Sexual Activity   Alcohol use: No   Drug use: No   Sexual activity: Not on file  Other Topics Concern   Not on file  Social History Narrative   Lives at home with his wife.   1 cup caffeine per day.   Left-handed.   Social Determinants of Health   Financial Resource Strain: Low Risk    Difficulty of Paying Living Expenses: Not hard at  all  Food Insecurity: Not on file  Transportation Needs: Not on file  Physical Activity: Not on file  Stress: Not on file  Social Connections: Not on file  Intimate Partner Violence: Not on file    Outpatient Medications Prior to Visit  Medication Sig Dispense Refill   amLODipine (NORVASC) 5 MG tablet TAKE 1 TABLET(5 MG) BY MOUTH DAILY 90 tablet 3   aspirin EC 81 MG tablet Take 81 mg by mouth daily.     atorvastatin (LIPITOR) 80 MG tablet Take 1 tablet (80 mg total) by mouth daily. 90 tablet 4   metoprolol succinate (TOPROL-XL) 25 MG 24 hr tablet TAKE 1 TABLET(25 MG) BY MOUTH DAILY 90 tablet 3   No facility-administered medications prior to visit.    Allergies  Allergen Reactions   Topiramate Other (See Comments)    Fatigue, nausea and weakness    ROS Review of Systems  Constitutional:  Positive for unexpected weight change. Negative for activity change, appetite change, chills, diaphoresis, fatigue and fever.  HENT: Negative.    Eyes:  Negative for photophobia and visual disturbance.  Respiratory:  Positive for shortness of breath. Negative for chest tightness and wheezing.  Cardiovascular:  Negative for chest pain, palpitations and leg swelling.  Gastrointestinal:  Negative for abdominal pain, anal bleeding, blood in stool, constipation, diarrhea, nausea and vomiting.  Endocrine: Negative for polyphagia and polyuria.  Genitourinary:  Negative for difficulty urinating, frequency, hematuria, testicular pain and urgency.  Musculoskeletal:  Negative for gait problem and joint swelling.  Skin:  Negative for pallor and rash.  Neurological:  Negative for speech difficulty, weakness and light-headedness.  Hematological:  Does not bruise/bleed easily.  Psychiatric/Behavioral: Negative.       Objective:    Physical Exam Vitals and nursing note reviewed.  Constitutional:      General: He is not in acute distress.    Appearance: Normal appearance. He is normal weight. He is not  ill-appearing, toxic-appearing or diaphoretic.  HENT:     Head: Normocephalic and atraumatic.     Right Ear: Tympanic membrane, ear canal and external ear normal.     Left Ear: Tympanic membrane, ear canal and external ear normal.     Mouth/Throat:     Mouth: Mucous membranes are moist.     Pharynx: Oropharynx is clear. No oropharyngeal exudate or posterior oropharyngeal erythema.  Eyes:     General: No scleral icterus.       Right eye: No discharge.        Left eye: No discharge.     Extraocular Movements: Extraocular movements intact.     Conjunctiva/sclera: Conjunctivae normal.     Pupils: Pupils are equal, round, and reactive to light.  Neck:     Vascular: No carotid bruit.  Cardiovascular:     Rate and Rhythm: Normal rate and regular rhythm.  Pulmonary:     Effort: Pulmonary effort is normal.     Breath sounds: Normal breath sounds.  Abdominal:     General: Abdomen is flat. Bowel sounds are normal. There is no distension.     Palpations: There is no mass.     Tenderness: There is no abdominal tenderness. There is no guarding or rebound.     Hernia: No hernia is present.  Musculoskeletal:     Cervical back: No rigidity or tenderness.     Right lower leg: No edema.     Left lower leg: No edema.  Lymphadenopathy:     Cervical: No cervical adenopathy.  Skin:    General: Skin is warm and dry.  Neurological:     Mental Status: He is alert and oriented to person, place, and time.  Psychiatric:        Mood and Affect: Mood normal.        Behavior: Behavior normal.    BP 140/70 (BP Location: Right Arm, Patient Position: Sitting, Cuff Size: Normal)   Pulse 62   Temp (!) 97.3 F (36.3 C) (Temporal)   Ht 5\' 8"  (1.727 m)   Wt 167 lb (75.8 kg)   SpO2 97%   BMI 25.39 kg/m  Wt Readings from Last 3 Encounters:  05/28/21 167 lb (75.8 kg)  06/13/20 175 lb 3.2 oz (79.5 kg)  12/21/19 173 lb (78.5 kg)     Health Maintenance Due  Topic Date Due   Hepatitis C Screening  Never  done   TETANUS/TDAP  Never done   Zoster Vaccines- Shingrix (1 of 2) Never done   Pneumonia Vaccine 64+ Years old (2 - PPSV23 if available, else PCV20) 09/26/2015    There are no preventive care reminders to display for this patient.  Lab Results  Component Value Date  TSH 2.43 06/13/2020   Lab Results  Component Value Date   WBC 7.2 05/29/2021   HGB 11.9 (L) 05/29/2021   HCT 36.3 (L) 05/29/2021   MCV 90.5 05/29/2021   PLT 176.0 05/29/2021   Lab Results  Component Value Date   NA 142 05/29/2021   K 3.9 05/29/2021   CO2 28 05/29/2021   GLUCOSE 80 05/29/2021   BUN 16 05/29/2021   CREATININE 1.23 05/29/2021   BILITOT 1.0 05/29/2021   ALKPHOS 92 05/29/2021   AST 21 05/29/2021   ALT 10 05/29/2021   PROT 6.8 05/29/2021   ALBUMIN 3.9 05/29/2021   CALCIUM 8.6 05/29/2021   GFR 55.86 (L) 05/29/2021   Lab Results  Component Value Date   CHOL 160 05/29/2021   Lab Results  Component Value Date   HDL 39.00 (L) 05/29/2021   Lab Results  Component Value Date   LDLCALC 108 (H) 05/29/2021   Lab Results  Component Value Date   TRIG 66.0 05/29/2021   Lab Results  Component Value Date   CHOLHDL 4 05/29/2021   No results found for: HGBA1C    Assessment & Plan:   Problem List Items Addressed This Visit       Cardiovascular and Mediastinum   Essential hypertension   Relevant Orders   Lipid panel (Completed)   Urinalysis, Routine w reflex microscopic (Completed)     Other   Need for influenza vaccination - Primary   Relevant Orders   Flu Vaccine QUAD High Dose(Fluad) (Completed)   Elevated serum cholesterol   Relevant Orders   Amylase (Completed)   Hematuria   Relevant Orders   Ambulatory referral to Urology   SOB (shortness of breath)   Relevant Orders   EKG 12-Lead (Completed)   DG Chest 2 View (Completed)   Ambulatory referral to Pulmonology   History of coronary atherosclerosis   Relevant Orders   EKG 12-Lead (Completed)   CBC (Completed)    Comprehensive metabolic panel (Completed)   Lipid panel (Completed)   Other Visit Diagnoses     Weight loss, unintentional       Relevant Orders   Sedimentation rate (Completed)   Screening for prostate cancer       Relevant Orders   PSA (Completed)       No orders of the defined types were placed in this encounter.   Follow-up: Return in about 3 months (around 08/28/2021).   At this point no obvious cause for his shortness of breath.  EKG and exam are normal.  Chest x-ray pending.  Consider deconditioning.  Cardiology consultation may be appropriate with elevated ldl cholesterol with low HDL.  No obvious cause for weight loss at this time.  Labs are pending for when patient returns fasting. [name], MD

## 2021-05-29 ENCOUNTER — Other Ambulatory Visit (INDEPENDENT_AMBULATORY_CARE_PROVIDER_SITE_OTHER): Payer: Medicare Other

## 2021-05-29 DIAGNOSIS — E789 Disorder of lipoprotein metabolism, unspecified: Secondary | ICD-10-CM

## 2021-05-29 DIAGNOSIS — Z125 Encounter for screening for malignant neoplasm of prostate: Secondary | ICD-10-CM | POA: Diagnosis not present

## 2021-05-29 DIAGNOSIS — R634 Abnormal weight loss: Secondary | ICD-10-CM | POA: Diagnosis not present

## 2021-05-29 DIAGNOSIS — I10 Essential (primary) hypertension: Secondary | ICD-10-CM | POA: Diagnosis not present

## 2021-05-29 DIAGNOSIS — Z8679 Personal history of other diseases of the circulatory system: Secondary | ICD-10-CM

## 2021-05-29 LAB — URINALYSIS, ROUTINE W REFLEX MICROSCOPIC
Bilirubin Urine: NEGATIVE
Ketones, ur: NEGATIVE
Leukocytes,Ua: NEGATIVE
Nitrite: NEGATIVE
Specific Gravity, Urine: 1.025 (ref 1.000–1.030)
Total Protein, Urine: NEGATIVE
Urine Glucose: NEGATIVE
Urobilinogen, UA: 0.2 (ref 0.0–1.0)
pH: 5.5 (ref 5.0–8.0)

## 2021-05-29 LAB — LIPID PANEL
Cholesterol: 160 mg/dL (ref 0–200)
HDL: 39 mg/dL — ABNORMAL LOW (ref >39.00)
LDL Cholesterol: 108 mg/dL — ABNORMAL HIGH (ref 0–99)
NonHDL: 121.4
Total CHOL/HDL Ratio: 4
Triglycerides: 66 mg/dL (ref 0.0–149.0)
VLDL: 13.2 mg/dL (ref 0.0–40.0)

## 2021-05-29 LAB — COMPREHENSIVE METABOLIC PANEL
ALT: 10 U/L (ref 0–53)
AST: 21 U/L (ref 0–37)
Albumin: 3.9 g/dL (ref 3.5–5.2)
Alkaline Phosphatase: 92 U/L (ref 39–117)
BUN: 16 mg/dL (ref 6–23)
CO2: 28 mEq/L (ref 19–32)
Calcium: 8.6 mg/dL (ref 8.4–10.5)
Chloride: 106 mEq/L (ref 96–112)
Creatinine, Ser: 1.23 mg/dL (ref 0.40–1.50)
GFR: 55.86 mL/min — ABNORMAL LOW (ref >60.00)
Glucose, Bld: 80 mg/dL (ref 70–99)
Potassium: 3.9 mEq/L (ref 3.5–5.1)
Sodium: 142 mEq/L (ref 135–145)
Total Bilirubin: 1 mg/dL (ref 0.2–1.2)
Total Protein: 6.8 g/dL (ref 6.0–8.3)

## 2021-05-29 LAB — PSA: PSA: 3.78 ng/mL (ref 0.10–4.00)

## 2021-05-29 LAB — CBC
HCT: 36.3 % — ABNORMAL LOW (ref 39.0–52.0)
Hemoglobin: 11.9 g/dL — ABNORMAL LOW (ref 13.0–17.0)
MCHC: 32.9 g/dL (ref 30.0–36.0)
MCV: 90.5 fl (ref 78.0–100.0)
Platelets: 176 10*3/uL (ref 150.0–400.0)
RBC: 4.01 Mil/uL — ABNORMAL LOW (ref 4.22–5.81)
RDW: 13.2 % (ref 11.5–15.5)
WBC: 7.2 10*3/uL (ref 4.0–10.5)

## 2021-05-29 LAB — SEDIMENTATION RATE: Sed Rate: 64 mm/hr — ABNORMAL HIGH (ref 0–20)

## 2021-05-29 LAB — AMYLASE: Amylase: 101 U/L (ref 27–131)

## 2021-05-29 NOTE — Addendum Note (Signed)
Addended by: Jon Billings on: 05/29/2021 08:37 AM   Modules accepted: Orders

## 2021-05-30 NOTE — Addendum Note (Signed)
Addended by: Abelino Derrick A on: 05/30/2021 11:03 AM   Modules accepted: Orders

## 2021-06-19 ENCOUNTER — Encounter: Payer: Self-pay | Admitting: Pulmonary Disease

## 2021-06-19 ENCOUNTER — Ambulatory Visit: Payer: Medicare Other | Admitting: Pulmonary Disease

## 2021-06-19 ENCOUNTER — Other Ambulatory Visit: Payer: Self-pay

## 2021-06-19 VITALS — BP 124/66 | HR 54 | Temp 98.2°F | Ht 72.0 in | Wt 165.2 lb

## 2021-06-19 DIAGNOSIS — J849 Interstitial pulmonary disease, unspecified: Secondary | ICD-10-CM | POA: Diagnosis not present

## 2021-06-19 NOTE — Progress Notes (Signed)
Jose Vaughn    [id number]    [birth year]  Primary Care Physician:Kremer, Mortimer Fries, MD  Referring Physician: [name], MD [address] [place],  [place] [address]  Chief complaint: Consult for abnormal chest x-ray, concern for interstitial lung disease  HPI: 80 year old with hypertension, hyperlipidemia, stroke Referred for evaluation of chronic dyspnea on exertion.  He has had symptoms for many years.  Has occasional cough which is nonproductive in nature.  Denies any wheezing. 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  No feather pillows or comforters Smoking history: 10-pack-year smoker.  Quit in 1970 Travel history: No significant travel history Relevant family history: No family history of lung disease  Outpatient Encounter Medications as of 06/19/2021  Medication Sig   amLODipine (NORVASC) 5 MG tablet TAKE 1 TABLET(5 MG) BY MOUTH DAILY   aspirin EC 81 MG tablet Take 81 mg by mouth daily.   atorvastatin (LIPITOR) 80 MG tablet Take 1 tablet (80 mg total) by mouth daily.   metoprolol succinate (TOPROL-XL) 25 MG 24 hr tablet TAKE 1 TABLET(25 MG) BY MOUTH DAILY   No facility-administered encounter medications on file as of 06/19/2021.    Allergies as of 06/19/2021 - Review Complete 06/19/2021  Allergen Reaction Noted   Topiramate Other (See Comments) 06/22/2018    Past Medical History:  Diagnosis Date   Frequent headaches    Hyperlipidemia    Hypertension    Stroke [facility])     No past surgical history on file.  Family History  Problem Relation Age of Onset   Heart attack Mother    Other Father        unsure   Ovarian cancer Sister     Social History   Socioeconomic History   Marital status: Married    Spouse name: Not on file   Number of children: 3   Years of education: 10th grade    Highest education level: Not on file  Occupational History   Occupation: Retired  Tobacco Use   Smoking status: Former    Types: Cigarettes   Smokeless tobacco: Never   Tobacco comments:    09/23/17 - quit 40+ years ago  Vaping Use   Vaping Use: Never used  Substance and Sexual Activity   Alcohol use: No   Drug use: No   Sexual activity: Not on file  Other Topics Concern   Not on file  Social History Narrative   Lives at home with his wife.   1 cup caffeine per day.   Left-handed.   Social Determinants of Health   Financial Resource Strain: Low Risk    Difficulty of Paying Living Expenses: Not hard at all  Food Insecurity: Not on file  Transportation Needs: Not on file  Physical Activity: Not on file  Stress: Not on file  Social Connections: Not on file  Intimate Partner Violence: Not on file    Review of systems: Review of Systems  Constitutional: Negative for fever and chills.  HENT: Negative.   Eyes: Negative for blurred vision.  Respiratory: as per HPI  Cardiovascular: Negative for chest pain and palpitations.  Gastrointestinal: Negative for vomiting, diarrhea, blood per rectum. Genitourinary: Negative for dysuria, urgency, frequency and hematuria.  Musculoskeletal: Negative for myalgias, back pain and joint pain.  Skin: Negative for itching and rash.  Neurological: Negative for dizziness,  tremors, focal weakness, seizures and loss of consciousness.  Endo/Heme/Allergies: Negative for environmental allergies.  Psychiatric/Behavioral: Negative for depression, suicidal ideas and hallucinations.  All other systems reviewed and are negative.  Physical Exam: Blood pressure 124/66, pulse (!) 54, temperature 98.2 F (36.8 C), temperature source Oral, height 6' (1.829 m), weight 165 lb 3.2 oz (74.9 kg), SpO2 98 %. Gen:      No acute distress HEENT:  EOMI, sclera anicteric Neck:     No masses; no thyromegaly Lungs:    Clear to auscultation bilaterally; normal  respiratory effort CV:         Regular rate and rhythm; no murmurs Abd:      + bowel sounds; soft, non-tender; no palpable masses, no distension Ext:    No edema; adequate peripheral perfusion Skin:      Warm and dry; no rash Neuro: alert and oriented x 3 Psych: normal mood and affect  Data Reviewed: Imaging: Chest x-ray 05/28/2021-mild chronic interstitial changes.  I have reviewed the images personally.  PFTs:  Labs:  Assessment:  Evaluation for dyspnea, interstitial lung disease Chest x-ray reviewed which shows mild chronic interstitial changes.  He does not have significant exposures or signs and symptoms of connective tissue disease.  We will start off by ordering a high-res CT and PFTs for better evaluation of the lungs.  Follow-up in clinic for review of these tests and plan for next steps.  Plan/Recommendations: High-res CT and PFTs   Marshell Garfinkel MD [facility] 06/19/2021, 11:53 AM  CC: [name],*

## 2021-06-19 NOTE — Patient Instructions (Signed)
We will schedule you for a high-resolution CT and PFTs for better evaluation of lungs Follow-up in clinic after these tests for review and plan for next steps

## 2021-06-21 ENCOUNTER — Telehealth (HOSPITAL_BASED_OUTPATIENT_CLINIC_OR_DEPARTMENT_OTHER): Payer: Self-pay

## 2021-07-16 ENCOUNTER — Ambulatory Visit
Admission: RE | Admit: 2021-07-16 | Discharge: 2021-07-16 | Disposition: A | Discharge status: Home / Self Care | Payer: Medicare Other | Source: Ambulatory Visit | Attending: Pulmonary Disease | Admitting: Pulmonary Disease

## 2021-07-16 DIAGNOSIS — I700 Atherosclerosis of aorta: Secondary | ICD-10-CM | POA: Diagnosis not present

## 2021-07-16 DIAGNOSIS — I2510 Atherosclerotic heart disease of native coronary artery without angina pectoris: Secondary | ICD-10-CM | POA: Diagnosis not present

## 2021-07-16 DIAGNOSIS — J398 Other specified diseases of upper respiratory tract: Secondary | ICD-10-CM | POA: Diagnosis not present

## 2021-07-16 DIAGNOSIS — J849 Interstitial pulmonary disease, unspecified: Secondary | ICD-10-CM

## 2021-07-16 DIAGNOSIS — R0602 Shortness of breath: Secondary | ICD-10-CM | POA: Diagnosis not present

## 2021-07-24 ENCOUNTER — Telehealth: Payer: Self-pay

## 2021-07-24 NOTE — Progress Notes (Signed)
Chronic Care Management Pharmacy Assistant   Name: Jose Vaughn  MRN: [id number] DOB: 17-Dec-1940  Reason for Encounter:Hypertension Disease State Call.   Recent office visits:  05/28/2021 Dr. Ethelene Hal MD (PCP) No Medication Changes noted, Return in about 3 months   Recent consult visits:  06/19/2021 Dr. Vaughan Browner MD (Pulmonology) No medication Changes noted  Hospital visits:  None in previous 6 months  Medications: Outpatient Encounter Medications as of 07/24/2021  Medication Sig   amLODipine (NORVASC) 5 MG tablet TAKE 1 TABLET(5 MG) BY MOUTH DAILY   aspirin EC 81 MG tablet Take 81 mg by mouth daily.   atorvastatin (LIPITOR) 80 MG tablet Take 1 tablet (80 mg total) by mouth daily.   metoprolol succinate (TOPROL-XL) 25 MG 24 hr tablet TAKE 1 TABLET(25 MG) BY MOUTH DAILY   No facility-administered encounter medications on file as of 07/24/2021.    Care Gaps: Hepatitis C Screening Tetanus Vaccine Shingrix Vaccine Pneumonia Vaccine HTN was 140/70 on 05/28/2021 [facility] Drugs: Atorvastatin 80 mg last filled on 02/14/2021 for 90 day supply at [facility]. Medication Fill Gaps: Imipramine 25 MG last filled 03/23/2021 90 day supply   Reviewed chart prior to disease state call. Spoke with patient regarding BP  Recent Office Vitals: BP Readings from Last 3 Encounters:  06/19/21 124/66  05/28/21 140/70  06/13/20 (!) 178/88   Pulse Readings from Last 3 Encounters:  06/19/21 (!) 54  05/28/21 62  06/13/20 86    Wt Readings from Last 3 Encounters:  06/19/21 165 lb 3.2 oz (74.9 kg)  05/28/21 167 lb (75.8 kg)  06/13/20 175 lb 3.2 oz (79.5 kg)     Kidney Function Lab Results  Component Value Date/Time   CREATININE 1.23 05/29/2021 11:22 AM   CREATININE 1.34 06/13/2020 10:12 AM   GFR 55.86 (L) 05/29/2021 11:22 AM   GFRNONAA 55 (L) 12/29/2017 01:47 PM   GFRAA 63 12/29/2017 01:47 PM    BMP Latest Ref Rng & Units 05/29/2021 06/13/2020 09/20/2019  Glucose 70 - 99 mg/dL  80 83 107(H)  BUN 6 - 23 mg/dL 16 21 22   Creatinine 0.40 - 1.50 mg/dL 1.23 1.34 1.30  BUN/Creat Ratio 10 - 24 - - -  Sodium 135 - 145 mEq/L 142 141 139  Potassium 3.5 - 5.1 mEq/L 3.9 4.0 4.1  Chloride 96 - 112 mEq/L 106 106 106  CO2 19 - 32 mEq/L 28 25 28   Calcium 8.4 - 10.5 mg/dL 8.6 8.7 8.6    Current antihypertensive regimen:  Amlodipine 5 mg daily  Metoprolol XL 25 mg daily   Patient spouse denies headaches,lightheadedness.dizziness or side effects from Amlodipine. Metoprolol.   How often are you checking your Blood Pressure? infrequently Current home BP readings:  Patient spouse states patient states his blood pressure sometime, but she is unsure what his blood pressure ranges around.  What recent interventions/DTPs have been made by any provider to improve Blood Pressure control since last CPP Visit: None ID  Any recent hospitalizations or ED visits since last visit with CPP? No  What diet changes have been made to improve Blood Pressure Control?  Patient spouse states patient does limit his salt intake.  What exercise is being done to improve your Blood Pressure Control?  Patient spouse reports patient does a lot of yard work, walking , and works on cars.  Adherence Review: Is the patient currently on ACE/ARB medication? No Does the patient have >5 day gap between last estimated fill dates? No  Telephone follow  up appointment with care management team member scheduled for:  04/04/2022 at 3:00 PM  [facility] Pharmacist Assistant [phone]

## 2021-07-27 ENCOUNTER — Other Ambulatory Visit: Payer: Self-pay | Admitting: *Deleted

## 2021-07-27 DIAGNOSIS — J849 Interstitial pulmonary disease, unspecified: Secondary | ICD-10-CM

## 2021-08-01 ENCOUNTER — Ambulatory Visit (INDEPENDENT_AMBULATORY_CARE_PROVIDER_SITE_OTHER): Payer: Medicare Other | Admitting: Pulmonary Disease

## 2021-08-01 ENCOUNTER — Other Ambulatory Visit: Payer: Self-pay

## 2021-08-01 ENCOUNTER — Encounter: Payer: Self-pay | Admitting: Pulmonary Disease

## 2021-08-01 ENCOUNTER — Ambulatory Visit: Payer: Medicare Other | Admitting: Pulmonary Disease

## 2021-08-01 VITALS — BP 140/66 | HR 57 | Temp 97.9°F | Ht 69.0 in | Wt 164.0 lb

## 2021-08-01 DIAGNOSIS — J849 Interstitial pulmonary disease, unspecified: Secondary | ICD-10-CM

## 2021-08-01 LAB — PULMONARY FUNCTION TEST
DL/VA % pred: 93 %
DL/VA: 3.65 ml/min/mmHg/L
DLCO cor % pred: 73 %
DLCO cor: 17.58 ml/min/mmHg
DLCO unc % pred: 73 %
DLCO unc: 17.58 ml/min/mmHg
FEF 25-75 Post: 2.07 L/sec
FEF 25-75 Pre: 2.83 L/sec
FEF2575-%Change-Post: -26 %
FEF2575-%Pred-Post: 102 %
FEF2575-%Pred-Pre: 140 %
FEV1-%Change-Post: -6 %
FEV1-%Pred-Post: 96 %
FEV1-%Pred-Pre: 103 %
FEV1-Post: 2.48 L
FEV1-Pre: 2.66 L
FEV1FVC-%Change-Post: -7 %
FEV1FVC-%Pred-Pre: 109 %
FEV6-%Change-Post: 0 %
FEV6-%Pred-Post: 98 %
FEV6-%Pred-Pre: 97 %
FEV6-Post: 3.26 L
FEV6-Pre: 3.25 L
FEV6FVC-%Pred-Post: 106 %
FEV6FVC-%Pred-Pre: 106 %
FVC-%Change-Post: 1 %
FVC-%Pred-Post: 93 %
FVC-%Pred-Pre: 92 %
FVC-Post: 3.28 L
FVC-Pre: 3.25 L
Post FEV1/FVC ratio: 76 %
Post FEV6/FVC ratio: 100 %
Pre FEV1/FVC ratio: 82 %
Pre FEV6/FVC Ratio: 100 %
RV % pred: 58 %
RV: 1.5 L
TLC % pred: 66 %
TLC: 4.56 L

## 2021-08-01 NOTE — Patient Instructions (Signed)
I have reviewed your CT scan which shows mild scarring and inflammation in the lung.  This condition is called interstitial lung disease We will get some blood test and a questionnaire for further evaluation of this etiology Follow-up in 1 to 2 months

## 2021-08-01 NOTE — Progress Notes (Signed)
Full PFT performed today. °

## 2021-08-01 NOTE — Patient Instructions (Signed)
Full PFT performed today. °

## 2021-08-01 NOTE — Progress Notes (Signed)
° °      °  Jose Vaughn    [id number]    Apr 11, 1941  Primary Care Physician:Kremer, Mortimer Fries, MD  Referring Physician: [name], MD [place],  [place] [address]  Chief complaint: Follow-up for interstitial lung disease  HPI: 81 year old with hypertension, hyperlipidemia, stroke Referred for evaluation of chronic dyspnea on exertion.  He has had symptoms for many years.  Has occasional cough which is nonproductive in nature.  Denies any wheezing. 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  No feather pillows or comforters Smoking history: 10-pack-year smoker.  Quit in 1970 Travel history: No significant travel history Relevant family history: No family history of lung disease  Interim history: Is here for follow-up of CT.  We had ordered connective tissue labs but never got drawn States that he has chronic dyspnea which is unchanged  Outpatient Encounter Medications as of 08/01/2021  Medication Sig   amLODipine (NORVASC) 5 MG tablet TAKE 1 TABLET(5 MG) BY MOUTH DAILY   aspirin EC 81 MG tablet Take 81 mg by mouth daily.   atorvastatin (LIPITOR) 80 MG tablet Take 1 tablet (80 mg total) by mouth daily.   metoprolol succinate (TOPROL-XL) 25 MG 24 hr tablet TAKE 1 TABLET(25 MG) BY MOUTH DAILY   No facility-administered encounter medications on file as of 08/01/2021.    Physical Exam: Blood pressure 140/66, pulse (!) 57, temperature 97.9 F (36.6 C), temperature source Oral, height 5\' 9"  (1.753 m), weight 164 lb (74.4 kg), SpO2 99 %. Gen:      No acute distress HEENT:  EOMI, sclera anicteric Neck:     No masses; no thyromegaly Lungs:    Clear to auscultation bilaterally; normal respiratory effort CV:         Regular rate and rhythm; no murmurs Abd:      + bowel sounds; soft, non-tender; no palpable masses, no  distension Ext:    No edema; adequate peripheral perfusion Skin:      Warm and dry; no rash Neuro: alert and oriented x 3 Psych: normal mood and affect   Data Reviewed: Imaging: Chest x-ray 05/28/2021-mild chronic interstitial changes. High-resolution CT 07/16/2021-findings compatible with interstitial lung disease indeterminate for UIP, aortic and coronary atherosclerosis. I have reviewed the images personally.  PFTs: 08/01/2021 FVC 3.28 [93%], FEV1 2.48 [96%], F/F 76, TLC 4.56 [66%], DLCO 17.58 [73%] Mild restriction, minimal diffusion defect  Labs:  Assessment:  Follow-up for interstitial lung disease High-resolution CT reviewed with indeterminate pattern of pulmonary fibrosis and interstitial lung disease.  He does not have significant exposures or signs and symptoms of connective tissue disease.  Get CTD serologies and ILD questionnaire.  If negative then there is high probability that this is IPF May consider lung biopsy or bronchoscopy with invasive classifier.  Will need to discuss this further after return visit  Plan/Recommendations: CTD serologies, ILD questionnaire  Marshell Garfinkel MD [facility] 08/01/2021, 1:40 PM  CC: [name],*

## 2021-08-02 ENCOUNTER — Other Ambulatory Visit: Payer: Medicare Other

## 2021-08-02 ENCOUNTER — Telehealth: Payer: Self-pay | Admitting: Pulmonary Disease

## 2021-08-02 DIAGNOSIS — J849 Interstitial pulmonary disease, unspecified: Secondary | ICD-10-CM

## 2021-08-02 NOTE — Telephone Encounter (Signed)
ILD Packet was received and given to Dr. Vaughan Browner. Nothing further at this time.

## 2021-08-02 NOTE — Telephone Encounter (Signed)
Routing to [place] as an Pharmacist, hospital.

## 2021-08-03 ENCOUNTER — Other Ambulatory Visit: Payer: Self-pay | Admitting: Nurse Practitioner

## 2021-08-03 DIAGNOSIS — I10 Essential (primary) hypertension: Secondary | ICD-10-CM

## 2021-08-06 LAB — HYPERSENSITIVITY PNEUMONITIS
A. Pullulans Abs: NEGATIVE
A.Fumigatus #1 Abs: NEGATIVE
Micropolyspora faeni, IgG: NEGATIVE
Pigeon Serum Abs: NEGATIVE
Thermoact. Saccharii: NEGATIVE
Thermoactinomyces vulgaris, IgG: NEGATIVE

## 2021-08-06 LAB — SJOGREN'S SYNDROME ANTIBODS(SSA + SSB)
SSA (Ro) (ENA) Antibody, IgG: <1 AI
SSB (La) (ENA) Antibody, IgG: <1 AI

## 2021-08-06 LAB — ANA,IFA RA DIAG PNL W/RFLX TIT/PATN
Anti Nuclear Antibody (ANA): POSITIVE — AB
Cyclic Citrullin Peptide Ab: <16 UNITS
Rheumatoid fact SerPl-aCnc: <14 IU/mL (ref <14)

## 2021-08-06 LAB — ANTI-SCLERODERMA ANTIBODY: Scleroderma (Scl-70) (ENA) Antibody, IgG: <1 AI

## 2021-08-06 LAB — ANTI-NUCLEAR AB-TITER (ANA TITER): ANA Titer 1: 1:40 {titer} — ABNORMAL HIGH

## 2021-08-06 LAB — ANCA SCREEN W REFLEX TITER: ANCA Screen: NEGATIVE

## 2021-08-07 ENCOUNTER — Other Ambulatory Visit: Payer: Self-pay | Admitting: Nurse Practitioner

## 2021-08-07 DIAGNOSIS — I10 Essential (primary) hypertension: Secondary | ICD-10-CM

## 2021-08-08 ENCOUNTER — Other Ambulatory Visit: Payer: Self-pay | Admitting: Family Medicine

## 2021-08-08 DIAGNOSIS — I10 Essential (primary) hypertension: Secondary | ICD-10-CM

## 2021-08-08 NOTE — Telephone Encounter (Signed)
Chart supports rx refill Last ov: 05/09/2021 Last refill: 03/08/2021 FOV: 08/31/2021

## 2021-08-16 LAB — MYOMARKER 3 PLUS PROFILE (RDL)

## 2021-08-21 ENCOUNTER — Telehealth: Payer: Self-pay | Admitting: Pulmonary Disease

## 2021-08-21 NOTE — Telephone Encounter (Signed)
ATC patient with lab results.  No answer and VM is currently full.

## 2021-08-22 NOTE — Telephone Encounter (Signed)
Called and spoke with patient.  Dr. Matilde Bash results and recommendations given.  Understanding stated.  Nothing further at this time.    Marshell Garfinkel, MD  08/21/2021  3:47 PM EST     Labs show borderline changes that do not appear significant. I will review in detail at time of return visit.

## 2021-08-31 ENCOUNTER — Ambulatory Visit: Payer: Medicare Other | Admitting: Family Medicine

## 2021-09-26 ENCOUNTER — Encounter: Payer: Self-pay | Admitting: Pulmonary Disease

## 2021-09-26 ENCOUNTER — Other Ambulatory Visit: Payer: Self-pay

## 2021-09-26 ENCOUNTER — Ambulatory Visit (INDEPENDENT_AMBULATORY_CARE_PROVIDER_SITE_OTHER): Payer: Medicare Other | Admitting: Pulmonary Disease

## 2021-09-26 VITALS — BP 130/62 | HR 60 | Temp 98.5°F | Ht 66.0 in | Wt 164.4 lb

## 2021-09-26 DIAGNOSIS — J849 Interstitial pulmonary disease, unspecified: Secondary | ICD-10-CM | POA: Diagnosis not present

## 2021-09-26 NOTE — Progress Notes (Signed)
? ?      ?  Jose Vaughn    [id number]    [birth year] ? ?Primary Care Physician:Kremer, Mortimer Fries, MD ? ?Referring Physician: [name], MD ?[place] ?[place],  [place] [address] ? ?Chief complaint: Follow-up for interstitial lung disease ? ?HPI: ?81 year old with hypertension, hyperlipidemia, stroke ?Referred for evaluation of chronic dyspnea on exertion.  He has had symptoms for many years.  Has occasional cough which is nonproductive in nature.  Denies any wheezing. ?He had a chest x-ray recently at primary care which showed chronic interstitial changes ? ?Pets: No pets ?Occupation: Retired Administrator ?Exposures: No mold, hot tub, Jacuzzi.  No feather pillows or comforters ?Smoking history: 10-pack-year smoker.  Quit in 1970 ?Travel history: No significant travel history ?Relevant family history: No family history of lung disease ? ?Interim history: ?He is here for review of his labs ?States that breathing is stable with minimal dyspnea on exertion.  No new complaints ? ? ? ?Outpatient Encounter Medications as of 09/26/2021  ?Medication Sig  ? amLODipine (NORVASC) 5 MG tablet TAKE 1 TABLET(5 MG) BY MOUTH DAILY  ? aspirin EC 81 MG tablet Take 81 mg by mouth daily.  ? atorvastatin (LIPITOR) 80 MG tablet Take 1 tablet (80 mg total) by mouth daily.  ? metoprolol succinate (TOPROL-XL) 25 MG 24 hr tablet TAKE 1 TABLET(25 MG) BY MOUTH DAILY  ? ?No facility-administered encounter medications on file as of 09/26/2021.  ? ? ?Physical Exam: ?Blood pressure 130/62, pulse 60, temperature 98.5 ?F (36.9 ?C), temperature source Oral, height '5\' 6"'$  (1.676 m), weight 164 lb 6.4 oz (74.6 kg), SpO2 99 %. ?Gen:      No acute distress ?HEENT:  EOMI, sclera anicteric ?Neck:     No masses; no thyromegaly ?Lungs:    Bibasal crackles ?CV:         Regular rate and rhythm; no murmurs ?Abd:      + bowel sounds; soft, non-tender; no palpable masses, no distension ?Ext:    No edema; adequate peripheral  perfusion ?Skin:      Warm and dry; no rash ?Neuro: alert and oriented x 3 ?Psych: normal mood and affect  ? ?Data Reviewed: ?Imaging: ?Chest x-ray 05/28/2021-mild chronic interstitial changes. ?High-resolution CT 07/16/2021-findings compatible with interstitial lung disease indeterminate for UIP, aortic and coronary atherosclerosis. ?I have reviewed the images personally. ? ?PFTs: ?08/01/2021 ?FVC 3.28 [93%], FEV1 2.48 [96%], F/F 76, TLC 4.56 [66%], DLCO 17.58 [73%] ?Mild restriction, minimal diffusion defect ? ?Labs: ?CTD serologies 08/02/2021- ANA 1:40, Nuclear speckled ? ?Assessment:  ?Follow-up for interstitial lung disease ?High-resolution CT reviewed with indeterminate pattern of pulmonary fibrosis and interstitial lung disease.  He does not have significant exposures or signs and symptoms of connective tissue disease.CTD serologies show borderline positive ANA which is nonsignificant.   ? ?This may be IPF. ?We discussed lung biopsy or bronchoscopy with envisia classifier but they are opposed to this due to risk of complications.  We also discussed conservative management and monitoring.  I suggested a follow-up CT and PFTs in 1 year but they feel these tests are a waste of money as he is feeling well.  They want to follow-up as needed if he is feeling any worse ? ?Plan/Recommendations: ?Return to clinic as needed ? ?Marshell Garfinkel MD ?[facility] ?09/26/2021, 2:37 PM ? ?CC: [name],* ? ?  ?

## 2021-09-28 ENCOUNTER — Ambulatory Visit (INDEPENDENT_AMBULATORY_CARE_PROVIDER_SITE_OTHER): Payer: Medicare Other | Admitting: Family Medicine

## 2021-09-28 ENCOUNTER — Encounter: Payer: Self-pay | Admitting: Family Medicine

## 2021-09-28 VITALS — BP 130/62 | HR 58 | Temp 97.2°F | Ht 66.0 in | Wt 165.0 lb

## 2021-09-28 DIAGNOSIS — R700 Elevated erythrocyte sedimentation rate: Secondary | ICD-10-CM

## 2021-09-28 DIAGNOSIS — I10 Essential (primary) hypertension: Secondary | ICD-10-CM

## 2021-09-28 DIAGNOSIS — R319 Hematuria, unspecified: Secondary | ICD-10-CM

## 2021-09-28 LAB — URINALYSIS, ROUTINE W REFLEX MICROSCOPIC
Bilirubin Urine: NEGATIVE
Ketones, ur: NEGATIVE
Leukocytes,Ua: NEGATIVE
Nitrite: NEGATIVE
Specific Gravity, Urine: 1.025 (ref 1.000–1.030)
Total Protein, Urine: NEGATIVE
Urine Glucose: NEGATIVE
Urobilinogen, UA: 0.2 (ref 0.0–1.0)
pH: 5.5 (ref 5.0–8.0)

## 2021-09-28 LAB — SEDIMENTATION RATE: Sed Rate: 53 mm/hr — ABNORMAL HIGH (ref 0–20)

## 2021-09-28 NOTE — Progress Notes (Signed)
? ?Established Patient Office Visit ? ?Subjective:  ?Patient ID: Jose Vaughn, male    DOB: [birth year]  Age: 81 y.o. MRN: [id number] ? ?CC:  ?Chief Complaint  ?Patient presents with  ? Follow-up  ?  Pt has no concerns.  ? ? ?HPI ?Jose Vaughn presents for follow-up hypertension, hematuria and elevated ESR.  Pulmonology suspects diagnosis of ILD.  ANA was positive with a speckled pattern.  Patient has history of pituitary adenoma.  Patient suffers from headaches associated with his adenoma.  Denies myalgias or weakness in his muscles, particularly proximal muscles.  No changes in bowel or bladder function.  Denies changes in bowel habits.  No blood in his stool.  Weight has been stable denies night sweats ? ?Past Medical History:  ?Diagnosis Date  ? Frequent headaches   ? Hyperlipidemia   ? Hypertension   ? Stroke [facility])   ? ? ?History reviewed. No pertinent surgical history. ? ?Family History  ?Problem Relation Age of Onset  ? Heart attack Mother   ? Other Father   ?     unsure  ? Ovarian cancer Sister   ? ? ?Social History  ? ?Socioeconomic History  ? Marital status: Married  ?  Spouse name: Not on file  ? Number of children: 3  ? Years of education: 10th grade  ? Highest education level: Not on file  ?Occupational History  ? Occupation: Retired  ?Tobacco Use  ? Smoking status: Former  ?  Types: Cigarettes  ? Smokeless tobacco: Never  ? Tobacco comments:  ?  09/23/17 - quit 40+ years ago  ?Vaping Use  ? Vaping Use: Never used  ?Substance and Sexual Activity  ? Alcohol use: No  ? Drug use: No  ? Sexual activity: Not on file  ?Other Topics Concern  ? Not on file  ?Social History Narrative  ? Lives at home with his wife.  ? 1 cup caffeine per day.  ? Left-handed.  ? ?Social Determinants of Health  ? ?Financial Resource Strain: Low Risk   ? Difficulty of Paying Living Expenses: Not hard at all  ?Food Insecurity: Not on file  ?Transportation Needs: Not on file  ?Physical Activity: Not on file  ?Stress: Not on file  ?Social  Connections: Not on file  ?Intimate Partner Violence: Not on file  ? ? ?Outpatient Medications Prior to Visit  ?Medication Sig Dispense Refill  ? amLODipine (NORVASC) 5 MG tablet TAKE 1 TABLET(5 MG) BY MOUTH DAILY 90 tablet 3  ? aspirin EC 81 MG tablet Take 81 mg by mouth daily.    ? atorvastatin (LIPITOR) 80 MG tablet Take 1 tablet (80 mg total) by mouth daily. 90 tablet 4  ? metoprolol succinate (TOPROL-XL) 25 MG 24 hr tablet TAKE 1 TABLET(25 MG) BY MOUTH DAILY 90 tablet 3  ? ?No facility-administered medications prior to visit.  ? ? ?Allergies  ?Allergen Reactions  ? Topiramate Other (See Comments)  ?  Fatigue, nausea and weakness  ? ? ?ROS ?Review of Systems  ?Constitutional:  Negative for chills, diaphoresis, fatigue, fever and unexpected weight change.  ?Respiratory: Negative.    ?Cardiovascular: Negative.   ?Gastrointestinal: Negative.  Negative for abdominal pain, anal bleeding, blood in stool, constipation, nausea and vomiting.  ?Genitourinary:  Negative for difficulty urinating, frequency, hematuria and urgency.  ?Musculoskeletal:  Negative for gait problem and joint swelling.  ?Neurological:  Positive for headaches. Negative for dizziness, facial asymmetry, speech difficulty, weakness and light-headedness.  ?Psychiatric/Behavioral: Negative.    ? ?  ?  Objective:  ?  ?Physical Exam ?Vitals and nursing note reviewed.  ?Constitutional:   ?   General: He is not in acute distress. ?   Appearance: Normal appearance. He is not ill-appearing, toxic-appearing or diaphoretic.  ?HENT:  ?   Head: Normocephalic and atraumatic.  ?   Right Ear: External ear normal.  ?   Left Ear: External ear normal.  ?   Mouth/Throat:  ?   Mouth: Mucous membranes are moist.  ?   Pharynx: Oropharynx is clear. No oropharyngeal exudate or posterior oropharyngeal erythema.  ?Eyes:  ?   General: No scleral icterus.    ?   Right eye: No discharge.     ?   Left eye: No discharge.  ?   Extraocular Movements: Extraocular movements intact.  ?    Conjunctiva/sclera: Conjunctivae normal.  ?   Pupils: Pupils are equal, round, and reactive to light.  ?Neck:  ?   Vascular: No carotid bruit.  ?Cardiovascular:  ?   Rate and Rhythm: Normal rate and regular rhythm.  ?Pulmonary:  ?   Effort: Pulmonary effort is normal.  ?   Breath sounds: Normal breath sounds. Decreased air movement present.  ?Abdominal:  ?   General: Bowel sounds are normal.  ?Musculoskeletal:  ?   Cervical back: No rigidity or tenderness.  ?Lymphadenopathy:  ?   Cervical: No cervical adenopathy.  ?Skin: ?   General: Skin is warm and dry.  ?Neurological:  ?   Mental Status: He is alert and oriented to person, place, and time.  ?Psychiatric:     ?   Mood and Affect: Mood normal.     ?   Behavior: Behavior normal.  ? ? ?BP 130/62 (BP Location: Right Arm, Patient Position: Sitting, Cuff Size: Normal)   Pulse (!) 58   Temp (!) 97.2 ?F (36.2 ?C) (Temporal)   Ht 5' 6"  (1.676 m)   Wt 165 lb (74.8 kg)   SpO2 94%   BMI 26.63 kg/m?  ?Wt Readings from Last 3 Encounters:  ?09/28/21 165 lb (74.8 kg)  ?09/26/21 164 lb 6.4 oz (74.6 kg)  ?08/01/21 164 lb (74.4 kg)  ? ? ? ?Health Maintenance Due  ?Topic Date Due  ? Hepatitis C Screening  Never done  ? Pneumonia Vaccine 54+ Years old (2 - PPSV23 if available, else PCV20) 09/26/2015  ? ? ?There are no preventive care reminders to display for this patient. ? ?Lab Results  ?Component Value Date  ? TSH 2.43 06/13/2020  ? ?Lab Results  ?Component Value Date  ? WBC 7.2 05/29/2021  ? HGB 11.9 (L) 05/29/2021  ? HCT 36.3 (L) 05/29/2021  ? MCV 90.5 05/29/2021  ? PLT 176.0 05/29/2021  ? ?Lab Results  ?Component Value Date  ? NA 142 05/29/2021  ? K 3.9 05/29/2021  ? CO2 28 05/29/2021  ? GLUCOSE 80 05/29/2021  ? BUN 16 05/29/2021  ? CREATININE 1.23 05/29/2021  ? BILITOT 1.0 05/29/2021  ? ALKPHOS 92 05/29/2021  ? AST 21 05/29/2021  ? ALT 10 05/29/2021  ? PROT 6.8 05/29/2021  ? ALBUMIN 3.9 05/29/2021  ? CALCIUM 8.6 05/29/2021  ? GFR 55.86 (L) 05/29/2021  ? ?Lab Results   ?Component Value Date  ? CHOL 160 05/29/2021  ? ?Lab Results  ?Component Value Date  ? HDL 39.00 (L) 05/29/2021  ? ?Lab Results  ?Component Value Date  ? LDLCALC 108 (H) 05/29/2021  ? ?Lab Results  ?Component Value Date  ? TRIG 66.0 05/29/2021  ? ?  Lab Results  ?Component Value Date  ? CHOLHDL 4 05/29/2021  ? ?No results found for: HGBA1C ? ?  ?Assessment & Plan:  ? ?Problem List Items Addressed This Visit   ? ?  ? Cardiovascular and Mediastinum  ? Essential hypertension - Primary  ?  ? Other  ? Hematuria  ? Relevant Orders  ? Urinalysis, Routine w reflex microscopic  ? ?Other Visit Diagnoses   ? ? ESR raised      ? Relevant Orders  ? Sedimentation rate  ? ?  ? ? ?No orders of the defined types were placed in this encounter. ? ? ?Follow-up: Return in about 3 months (around 12/29/2021).  ? ? ?[name], MD ?

## 2021-11-01 ENCOUNTER — Other Ambulatory Visit: Payer: Self-pay | Admitting: Family Medicine

## 2021-11-01 DIAGNOSIS — E789 Disorder of lipoprotein metabolism, unspecified: Secondary | ICD-10-CM

## 2021-12-28 ENCOUNTER — Ambulatory Visit: Payer: Medicare Other | Admitting: Family Medicine

## 2022-01-14 ENCOUNTER — Ambulatory Visit (INDEPENDENT_AMBULATORY_CARE_PROVIDER_SITE_OTHER): Payer: Medicare Other | Admitting: Family Medicine

## 2022-01-14 ENCOUNTER — Encounter: Payer: Self-pay | Admitting: Family Medicine

## 2022-01-14 VITALS — BP 152/76 | HR 57 | Temp 97.3°F | Ht 66.0 in | Wt 162.8 lb

## 2022-01-14 DIAGNOSIS — I10 Essential (primary) hypertension: Secondary | ICD-10-CM | POA: Diagnosis not present

## 2022-01-14 DIAGNOSIS — R700 Elevated erythrocyte sedimentation rate: Secondary | ICD-10-CM | POA: Diagnosis not present

## 2022-01-14 DIAGNOSIS — R634 Abnormal weight loss: Secondary | ICD-10-CM | POA: Diagnosis not present

## 2022-01-14 DIAGNOSIS — R0602 Shortness of breath: Secondary | ICD-10-CM

## 2022-01-14 DIAGNOSIS — R319 Hematuria, unspecified: Secondary | ICD-10-CM

## 2022-01-14 NOTE — Progress Notes (Signed)
Established Patient Office Visit  Subjective   Patient ID: Jose Vaughn, male    DOB: Dec 31, 1940  Age: 81 y.o. MRN: [id number]  Chief Complaint  Patient presents with   Follow-up    3 month follow up, concerns about continued weight loss over the past year. Patient not fasting.     HPI for follow-up of weight loss over the last year and a half.  Weight 175 in 06/2020.  Appetite has been good.  He feels well.  Denies night sweats.  There has been no difficulty chewing or swallowing his food.  Denies reflux or indigestion.  No difficulty with stooling.  No hematochezia or melena.  No difficulty with urination.  He has seen no blood in his urine.  Denies pain or weakness in his shoulders or thighs.  Has no difficulty with over shoulder work or getting up out of a chair.  Sed rate has been elevated.  He does have interstitial lung disease.  Denies sadness or depression.    Review of Systems  Constitutional: Negative.   HENT: Negative.    Eyes:  Negative for blurred vision, discharge and redness.  Respiratory: Negative.    Cardiovascular: Negative.   Gastrointestinal:  Negative for abdominal pain, blood in stool and melena.  Genitourinary: Negative.  Negative for frequency, hematuria and urgency.  Musculoskeletal: Negative.  Negative for myalgias.  Skin:  Negative for rash.  Neurological:  Negative for tingling, loss of consciousness and weakness.  Endo/Heme/Allergies:  Negative for polydipsia.  Psychiatric/Behavioral:  Negative for depression. The patient is not nervous/anxious.       Objective:     BP (!) 152/76 (BP Location: Right Arm, Patient Position: Sitting, Cuff Size: Normal)   Pulse (!) 57   Temp (!) 97.3 F (36.3 C) (Temporal)   Ht 5' 6"  (1.676 m)   Wt 162 lb 12.8 oz (73.8 kg)   SpO2 97%   BMI 26.28 kg/m  Wt Readings from Last 3 Encounters:  01/14/22 162 lb 12.8 oz (73.8 kg)  09/28/21 165 lb (74.8 kg)  09/26/21 164 lb 6.4 oz (74.6 kg)      Physical  Exam Constitutional:      General: He is not in acute distress.    Appearance: Normal appearance. He is not ill-appearing, toxic-appearing or diaphoretic.  HENT:     Head: Normocephalic and atraumatic.     Right Ear: External ear normal.     Left Ear: External ear normal.     Mouth/Throat:     Mouth: Mucous membranes are moist.     Pharynx: Oropharynx is clear. No oropharyngeal exudate or posterior oropharyngeal erythema.  Eyes:     General: No scleral icterus.       Right eye: No discharge.        Left eye: No discharge.     Extraocular Movements: Extraocular movements intact.     Conjunctiva/sclera: Conjunctivae normal.     Pupils: Pupils are equal, round, and reactive to light.  Cardiovascular:     Rate and Rhythm: Normal rate and regular rhythm.  Pulmonary:     Effort: Pulmonary effort is normal. No respiratory distress.     Breath sounds: Normal breath sounds.  Abdominal:     General: Bowel sounds are normal.     Tenderness: There is no abdominal tenderness. There is no right CVA tenderness, left CVA tenderness or guarding.  Musculoskeletal:     Cervical back: No rigidity or tenderness.  Skin:    General: Skin  is warm and dry.  Neurological:     Mental Status: He is alert and oriented to person, place, and time.  Psychiatric:        Mood and Affect: Mood normal.        Behavior: Behavior normal.      No results found for any visits on 01/14/22.    The ASCVD Risk score (Arnett DK, et al., 2019) failed to calculate for the following reasons:   The 2019 ASCVD risk score is only valid for ages 28 to 37   The patient has a prior MI or stroke diagnosis    Assessment & Plan:   Problem List Items Addressed This Visit       Cardiovascular and Mediastinum   Essential hypertension   Relevant Orders   Comprehensive metabolic panel   CBC w/Diff     Other   Hematuria   Relevant Orders   Comprehensive metabolic panel   Urinalysis, Routine w reflex microscopic    CBC w/Diff   CT Abdomen Pelvis W Contrast   SOB (shortness of breath)   Other Visit Diagnoses     ESR raised    -  Primary   Relevant Orders   Sedimentation rate   Weight loss, unintentional       Relevant Orders   Sedimentation rate   Prealbumin   CT Abdomen Pelvis W Contrast       Return in about 2 months (around 03/17/2022).    [name], MD

## 2022-01-14 NOTE — Addendum Note (Signed)
Addended by: Lynnea Ferrier on: 01/14/2022 03:50 PM   Modules accepted: Orders

## 2022-01-15 LAB — URINALYSIS, ROUTINE W REFLEX MICROSCOPIC
Bilirubin Urine: NEGATIVE
Ketones, ur: NEGATIVE
Leukocytes,Ua: NEGATIVE
Nitrite: NEGATIVE
Specific Gravity, Urine: 1.025 (ref 1.000–1.030)
Total Protein, Urine: NEGATIVE
Urine Glucose: NEGATIVE
Urobilinogen, UA: 0.2 (ref 0.0–1.0)
pH: 5.5 (ref 5.0–8.0)

## 2022-02-12 ENCOUNTER — Other Ambulatory Visit: Payer: Self-pay | Admitting: Family Medicine

## 2022-02-12 DIAGNOSIS — E789 Disorder of lipoprotein metabolism, unspecified: Secondary | ICD-10-CM

## 2022-02-27 ENCOUNTER — Telehealth: Payer: Self-pay | Admitting: Family Medicine

## 2022-02-27 NOTE — Telephone Encounter (Signed)
Caller Name: [place] Call back phone #: [phone]  Reason for Call: Please call to speak about order put in by Dr. Ethelene Hal, there are some questions she has and needs clarification before appointment this Monday. May have to reschedule as well

## 2022-02-27 NOTE — Telephone Encounter (Signed)
Spoke with Eliezer Lofts at [facility] who's asking if Dr. Ethelene Hal would like a CT hematuria workup protocol or leave the order as is for the CT abdomen pelvis W/ contrast? Please advise patient has appointment 03/04/22 at 12pm.

## 2022-03-04 ENCOUNTER — Ambulatory Visit (HOSPITAL_BASED_OUTPATIENT_CLINIC_OR_DEPARTMENT_OTHER)
Admission: RE | Admit: 2022-03-04 | Discharge: 2022-03-04 | Disposition: A | Discharge status: Home / Self Care | Payer: Medicare Other | Source: Ambulatory Visit | Attending: Family Medicine | Admitting: Family Medicine

## 2022-03-04 ENCOUNTER — Encounter (HOSPITAL_BASED_OUTPATIENT_CLINIC_OR_DEPARTMENT_OTHER): Payer: Self-pay

## 2022-03-04 DIAGNOSIS — K5730 Diverticulosis of large intestine without perforation or abscess without bleeding: Secondary | ICD-10-CM | POA: Diagnosis not present

## 2022-03-04 DIAGNOSIS — R319 Hematuria, unspecified: Secondary | ICD-10-CM | POA: Insufficient documentation

## 2022-03-04 DIAGNOSIS — R634 Abnormal weight loss: Secondary | ICD-10-CM | POA: Diagnosis not present

## 2022-03-04 MED ORDER — IOHEXOL 300 MG/ML  SOLN
100.0000 mL | Freq: Once | INTRAMUSCULAR | Status: AC | PRN
  Administered 2022-03-04: 100 mL via INTRAVENOUS

## 2022-03-04 MED ORDER — IOHEXOL 300 MG/ML  SOLN: 100.0000 mL | Freq: Once | INTRAMUSCULAR | Status: DC | PRN

## 2022-03-04 NOTE — Telephone Encounter (Signed)
Ordered CT preformed today

## 2022-03-08 DIAGNOSIS — H35373 Puckering of macula, bilateral: Secondary | ICD-10-CM | POA: Diagnosis not present

## 2022-03-08 DIAGNOSIS — H53453 Other localized visual field defect, bilateral: Secondary | ICD-10-CM | POA: Diagnosis not present

## 2022-03-08 DIAGNOSIS — H02831 Dermatochalasis of right upper eyelid: Secondary | ICD-10-CM | POA: Diagnosis not present

## 2022-03-08 DIAGNOSIS — H35033 Hypertensive retinopathy, bilateral: Secondary | ICD-10-CM | POA: Diagnosis not present

## 2022-03-08 DIAGNOSIS — H02834 Dermatochalasis of left upper eyelid: Secondary | ICD-10-CM | POA: Diagnosis not present

## 2022-04-04 ENCOUNTER — Telehealth: Payer: Medicare Other

## 2022-06-07 DIAGNOSIS — H53453 Other localized visual field defect, bilateral: Secondary | ICD-10-CM | POA: Diagnosis not present

## 2022-06-07 DIAGNOSIS — D352 Benign neoplasm of pituitary gland: Secondary | ICD-10-CM | POA: Diagnosis not present

## 2022-06-11 ENCOUNTER — Ambulatory Visit (INDEPENDENT_AMBULATORY_CARE_PROVIDER_SITE_OTHER): Payer: Medicare Other | Admitting: Family Medicine

## 2022-06-11 ENCOUNTER — Encounter: Payer: Self-pay | Admitting: Family Medicine

## 2022-06-11 VITALS — BP 138/68 | HR 50 | Temp 97.4°F | Ht 66.0 in | Wt 164.2 lb

## 2022-06-11 DIAGNOSIS — R319 Hematuria, unspecified: Secondary | ICD-10-CM

## 2022-06-11 DIAGNOSIS — Z23 Encounter for immunization: Secondary | ICD-10-CM

## 2022-06-11 DIAGNOSIS — H53489 Generalized contraction of visual field, unspecified eye: Secondary | ICD-10-CM | POA: Diagnosis not present

## 2022-06-11 DIAGNOSIS — E7800 Pure hypercholesterolemia, unspecified: Secondary | ICD-10-CM | POA: Diagnosis not present

## 2022-06-11 DIAGNOSIS — I10 Essential (primary) hypertension: Secondary | ICD-10-CM

## 2022-06-11 DIAGNOSIS — D352 Benign neoplasm of pituitary gland: Secondary | ICD-10-CM | POA: Diagnosis not present

## 2022-06-11 NOTE — Progress Notes (Signed)
Established Patient Office Visit   Subjective:  Patient ID: Jose Vaughn, male    DOB: [birth year]  Age: 81 y.o. MRN: [id number]  Chief Complaint  Patient presents with   Annual Exam    CPE, would like to discuss possible MRI recommended by eye doctor. Patient fasting.     HPI Encounter Diagnoses  Name Primary?   Essential hypertension Yes   Influenza vaccine needed    Hematuria, unspecified type    Pituitary adenoma (HCC)    Elevated serum cholesterol    Visual field constriction, unspecified laterality    Here for health check and follow-up of multiple health issues.  Seems to be doing well.  Continues to live with his wife.  He is edentulous and does not have a need for dental care.  He chooses not to exercise.  He continues amlodipine and metoprolol for hypertension.  Continues high-dose atorvastatin for elevated cholesterol with a history of coronary artery disease and cerebrovascular disease.  Received a note from his ophthalmologist Dr. Eulas Post.  Spoke with her assistant.  There is concern that his visual field cuts have extended.  Last MRI of the pituitary was in 2021.  Patient denies hematuria   Review of Systems  Constitutional: Negative.   HENT: Negative.    Eyes:  Negative for blurred vision, discharge and redness.  Respiratory: Negative.    Cardiovascular: Negative.   Gastrointestinal:  Negative for abdominal pain.  Genitourinary: Negative.  Negative for hematuria.  Musculoskeletal: Negative.  Negative for myalgias.  Skin:  Negative for rash.  Neurological:  Negative for tingling, loss of consciousness and weakness.  Endo/Heme/Allergies:  Negative for polydipsia.     Current Outpatient Medications:    amLODipine (NORVASC) 5 MG tablet, TAKE 1 TABLET(5 MG) BY MOUTH DAILY, Disp: 90 tablet, Rfl: 3   aspirin EC 81 MG tablet, Take 81 mg by mouth daily., Disp: , Rfl:    atorvastatin (LIPITOR) 80 MG tablet, TAKE 1 TABLET(80 MG) BY MOUTH DAILY, Disp: 90 tablet, Rfl: 0    metoprolol succinate (TOPROL-XL) 25 MG 24 hr tablet, TAKE 1 TABLET(25 MG) BY MOUTH DAILY, Disp: 90 tablet, Rfl: 3   Objective:     BP 138/68   Pulse (!) 50   Temp (!) 97.4 F (36.3 C) (Temporal)   Ht '5\' 6"'$  (1.676 m)   Wt 164 lb 3.2 oz (74.5 kg)   SpO2 99%   BMI 26.50 kg/m    Physical Exam Constitutional:      General: He is not in acute distress.    Appearance: Normal appearance. He is not ill-appearing, toxic-appearing or diaphoretic.  HENT:     Head: Normocephalic and atraumatic.     Right Ear: External ear normal.     Left Ear: External ear normal.     Mouth/Throat:     Mouth: Mucous membranes are moist.     Pharynx: Oropharynx is clear. No oropharyngeal exudate or posterior oropharyngeal erythema.  Eyes:     General: No scleral icterus.       Right eye: No discharge.        Left eye: No discharge.     Extraocular Movements: Extraocular movements intact.     Conjunctiva/sclera: Conjunctivae normal.     Pupils: Pupils are equal, round, and reactive to light.  Cardiovascular:     Rate and Rhythm: Normal rate and regular rhythm.  Pulmonary:     Effort: Pulmonary effort is normal. No respiratory distress.     Breath sounds: Normal  breath sounds.  Abdominal:     General: Bowel sounds are normal.     Tenderness: There is no abdominal tenderness. There is no guarding.  Musculoskeletal:     Cervical back: No rigidity or tenderness.  Skin:    General: Skin is warm and dry.  Neurological:     Mental Status: He is alert and oriented to person, place, and time.  Psychiatric:        Mood and Affect: Mood normal.        Behavior: Behavior normal.      No results found for any visits on 06/11/22.    The ASCVD Risk score (Arnett DK, et al., 2019) failed to calculate for the following reasons:   The 2019 ASCVD risk score is only valid for ages 53 to 8   The patient has a prior MI or stroke diagnosis    Assessment & Plan:   Essential hypertension -     CBC -      Comprehensive metabolic panel  Influenza vaccine needed -     Flu vaccine HIGH DOSE PF  Hematuria, unspecified type -     Urinalysis, Routine w reflex microscopic  Pituitary adenoma ([place]) -     Ambulatory referral to Neurology  Elevated serum cholesterol -     Lipid panel  Visual field constriction, unspecified laterality -     Ambulatory referral to Neurology    Return in about 6 months (around 12/11/2022), or if symptoms worsen or fail to improve.  Continue amlodipine, metoprolol and atorvastatin as above.  Referral back to neurology for consideration of follow-up MRI of benign pituitary macroadenoma.  Rechecking urinalysis.  [name], MD

## 2022-06-12 LAB — CBC
HCT: 37 % — ABNORMAL LOW (ref 39.0–52.0)
Hemoglobin: 12.5 g/dL — ABNORMAL LOW (ref 13.0–17.0)
MCHC: 33.7 g/dL (ref 30.0–36.0)
MCV: 90.3 fl (ref 78.0–100.0)
Platelets: 172 10*3/uL (ref 150.0–400.0)
RBC: 4.1 Mil/uL — ABNORMAL LOW (ref 4.22–5.81)
RDW: 13.7 % (ref 11.5–15.5)
WBC: 8.1 10*3/uL (ref 4.0–10.5)

## 2022-06-12 LAB — COMPREHENSIVE METABOLIC PANEL
ALT: 14 U/L (ref 0–53)
AST: 24 U/L (ref 0–37)
Albumin: 4.3 g/dL (ref 3.5–5.2)
Alkaline Phosphatase: 105 U/L (ref 39–117)
BUN: 19 mg/dL (ref 6–23)
CO2: 29 mEq/L (ref 19–32)
Calcium: 8.8 mg/dL (ref 8.4–10.5)
Chloride: 102 mEq/L (ref 96–112)
Creatinine, Ser: 1.17 mg/dL (ref 0.40–1.50)
GFR: 58.88 mL/min — ABNORMAL LOW (ref >60.00)
Glucose, Bld: 78 mg/dL (ref 70–99)
Potassium: 3.9 mEq/L (ref 3.5–5.1)
Sodium: 138 mEq/L (ref 135–145)
Total Bilirubin: 1 mg/dL (ref 0.2–1.2)
Total Protein: 7.5 g/dL (ref 6.0–8.3)

## 2022-06-12 LAB — URINALYSIS, ROUTINE W REFLEX MICROSCOPIC
Bilirubin Urine: NEGATIVE
Ketones, ur: NEGATIVE
Leukocytes,Ua: NEGATIVE
Nitrite: NEGATIVE
Specific Gravity, Urine: 1.02 (ref 1.000–1.030)
Total Protein, Urine: NEGATIVE
Urine Glucose: NEGATIVE
Urobilinogen, UA: 0.2 (ref 0.0–1.0)
pH: 6 (ref 5.0–8.0)

## 2022-06-12 LAB — LIPID PANEL
Cholesterol: 141 mg/dL (ref 0–200)
HDL: 40.6 mg/dL (ref >39.00)
LDL Cholesterol: 86 mg/dL (ref 0–99)
NonHDL: 99.94
Total CHOL/HDL Ratio: 3
Triglycerides: 70 mg/dL (ref 0.0–149.0)
VLDL: 14 mg/dL (ref 0.0–40.0)

## 2022-06-25 ENCOUNTER — Other Ambulatory Visit: Payer: Self-pay | Admitting: Family Medicine

## 2022-06-25 DIAGNOSIS — E7800 Pure hypercholesterolemia, unspecified: Secondary | ICD-10-CM

## 2022-07-16 ENCOUNTER — Encounter: Payer: Self-pay | Admitting: Family Medicine

## 2022-08-26 ENCOUNTER — Ambulatory Visit (INDEPENDENT_AMBULATORY_CARE_PROVIDER_SITE_OTHER): Payer: Medicare Other

## 2022-08-26 VITALS — Ht 66.0 in | Wt 160.0 lb

## 2022-08-26 DIAGNOSIS — Z0000 Encounter for general adult medical examination without abnormal findings: Secondary | ICD-10-CM

## 2022-08-26 NOTE — Progress Notes (Signed)
I connected with  Quaylon Avant on 08/26/22 by a audio enabled telemedicine application and verified that I am speaking with the correct person using two identifiers. Spouse Arbie Cookey was also on the call.  Patient Location: Home  Provider Location: Office/Clinic  I discussed the limitations of evaluation and management by telemedicine. The patient expressed understanding and agreed to proceed.  Subjective:   Jose Vaughn is a 82 y.o. male who presents for an Initial Medicare Annual Wellness Visit.  Review of Systems     Cardiac Risk Factors include: advanced age (>69mn, >>33women);hypertension;male gender     Objective:    Today's Vitals   08/26/22 1541  Weight: 160 lb (72.6 kg)  Height: '5\' 6"'$  (1.676 m)   Body mass index is 25.82 kg/m.     08/26/2022    3:44 PM  Advanced Directives  Does Patient Have a Medical Advance Directive? No    Current Medications (verified) Outpatient Encounter Medications as of 08/26/2022  Medication Sig   amLODipine (NORVASC) 5 MG tablet TAKE 1 TABLET(5 MG) BY MOUTH DAILY   aspirin EC 81 MG tablet Take 81 mg by mouth daily.   atorvastatin (LIPITOR) 80 MG tablet TAKE 1 TABLET(80 MG) BY MOUTH DAILY   metoprolol succinate (TOPROL-XL) 25 MG 24 hr tablet TAKE 1 TABLET(25 MG) BY MOUTH DAILY   No facility-administered encounter medications on file as of 08/26/2022.    Allergies (verified) Topiramate   History: Past Medical History:  Diagnosis Date   Frequent headaches    Hyperlipidemia    Hypertension    Stroke [facility]    History reviewed. No pertinent surgical history. Family History  Problem Relation Age of Onset   Heart attack Mother    Other Father        unsure   Ovarian cancer Sister    Social History   Socioeconomic History   Marital status: Married    Spouse name: Not on file   Number of children: 3   Years of education: 10th grade   Highest education level: Not on file  Occupational History   Occupation: Retired  Tobacco  Use   Smoking status: Former    Types: Cigarettes   Smokeless tobacco: Never   Tobacco comments:    09/23/17 - quit 40+ years ago  Vaping Use   Vaping Use: Never used  Substance and Sexual Activity   Alcohol use: No   Drug use: No   Sexual activity: Yes  Other Topics Concern   Not on file  Social History Narrative   Lives at home with his wife.   1 cup caffeine per day.   Left-handed.   Social Determinants of Health   Financial Resource Strain: Low Risk  (08/26/2022)   Overall Financial Resource Strain (CARDIA)    Difficulty of Paying Living Expenses: Not hard at all  Food Insecurity: No Food Insecurity (08/26/2022)   Hunger Vital Sign    Worried About Running Out of Food in the Last Year: Never true    Ran Out of Food in the Last Year: Never true  Transportation Needs: No Transportation Needs (08/26/2022)   PRAPARE - THydrologist(Medical): No    Lack of Transportation (Non-Medical): No  Physical Activity: Inactive (08/26/2022)   Exercise Vital Sign    Days of Exercise per Week: 0 days    Minutes of Exercise per Session: 0 min  Stress: No Stress Concern Present (08/26/2022)   [place]-  Occupational Stress Questionnaire    Feeling of Stress : Not at all  Social Connections: Not on file    Tobacco Counseling Counseling given: Not Answered Tobacco comments: 09/23/17 - quit 40+ years ago   Clinical Intake:  Pre-visit preparation completed: Yes  Pain : No/denies pain     Nutritional Status: BMI 25 -29 Overweight Nutritional Risks: None Diabetes: No  How often do you need to have someone help you when you read instructions, pamphlets, or other written materials from your doctor or pharmacy?: 1 - Never  Diabetic? no  Interpreter Needed?: No  Information entered by :: NAllen LPN   Activities of Daily Living    08/26/2022    3:44 PM  In your present state of health, do you have any difficulty  performing the following activities:  Hearing? 0  Vision? 0  Difficulty concentrating or making decisions? 0  Walking or climbing stairs? 0  Dressing or bathing? 0  Doing errands, shopping? 0  Preparing Food and eating ? N  Using the Toilet? N  In the past six months, have you accidently leaked urine? N  Do you have problems with loss of bowel control? N  Managing your Medications? N  Managing your Finances? N  Housekeeping or managing your Housekeeping? N    Patient Care Team: [name], MD as PCP - General (Family Medicine) Germaine Pomfret, [facility] as Pharmacist (Pharmacist)  Indicate any recent Medical Services you may have received from other than [facility] providers in the past year (date may be approximate).     Assessment:   This is a routine wellness examination for Jose Vaughn.  Hearing/Vision screen Vision Screening - Comments:: Regular eye exams, [facility]  Dietary issues and exercise activities discussed: Current Exercise Habits: The patient does not participate in regular exercise at present   Goals Addressed             This Visit's Progress    Patient Stated       08/26/2022, no goals       Depression Screen    08/26/2022    3:44 PM 06/11/2022    2:22 PM 01/14/2022    2:23 PM 09/28/2021    1:45 PM 05/28/2021    2:10 PM 05/28/2021   11:14 AM 12/21/2019    1:28 PM  PHQ 2/9 Scores  PHQ - 2 Score 0 0 0 0 0 0 0  PHQ- 9 Score     3      Fall Risk    08/26/2022    3:44 PM 06/11/2022    2:22 PM 01/14/2022    2:23 PM 09/28/2021    1:45 PM 05/28/2021   11:14 AM  Fall Risk   Falls in the past year? 0 0 0 0 0  Number falls in past yr: 0 0 0 0 0  Injury with Fall? 0 0  0   Risk for fall due to : Medication side effect No Fall Risks     Follow up Falls prevention discussed;Education provided;Falls evaluation completed Falls evaluation completed       FALL RISK PREVENTION PERTAINING TO THE HOME:  Any stairs in or around the home? Yes  If  so, are there any without handrails? No  Home free of loose throw rugs in walkways, pet beds, electrical cords, etc? Yes  Adequate lighting in your home to reduce risk of falls? Yes   ASSISTIVE DEVICES UTILIZED TO PREVENT FALLS:  Life alert? No  Use of  a cane, walker or w/c? No  Grab bars in the bathroom? No  Shower chair or bench in shower? No  Elevated toilet seat or a handicapped toilet? No   TIMED UP AND GO:  Was the test performed? No .      Cognitive Function:        08/26/2022    3:45 PM  6CIT Screen  What Year? 0 points  What month? 0 points  What time? 0 points  Count back from 20 2 points  Months in reverse 0 points  Repeat phrase 2 points  Total Score 4 points    Immunizations Immunization History  Administered Date(s) Administered   Fluad Quad(high Dose 65+) 06/17/2019, 06/13/2020, 05/28/2021   Influenza, High Dose Seasonal PF 07/28/2017, 06/11/2022   PFIZER(Purple Top)SARS-COV-2 Vaccination 10/21/2019, 11/15/2019   Pneumococcal Conjugate-13 09/26/2014    TDAP status: Due, Education has been provided regarding the importance of this vaccine. Advised may receive this vaccine at local pharmacy or Health Dept. Aware to provide a copy of the vaccination record if obtained from local pharmacy or Health Dept. Verbalized acceptance and understanding.  Flu Vaccine status: Up to date  Pneumococcal vaccine status: Up to date  Covid-19 vaccine status: Completed vaccines  Qualifies for Shingles Vaccine? Yes   Zostavax completed No   Shingrix Completed?: No.    Education has been provided regarding the importance of this vaccine. Patient has been advised to call insurance company to determine out of pocket expense if they have not yet received this vaccine. Advised may also receive vaccine at local pharmacy or Health Dept. Verbalized acceptance and understanding.  Screening Tests Health Maintenance  Topic Date Due   Medicare Annual Wellness (AWV)  Never done    Pneumonia Vaccine 72+ Years old (2 of 2 - PPSV23 or PCV20) 06/12/2023 (Originally 09/26/2015)   Zoster Vaccines- Shingrix (1 of 2) 09/10/2023 (Originally 12/11/1991)   INFLUENZA VACCINE  Completed   HPV VACCINES  Aged Out   DTaP/Tdap/Td  Discontinued   COVID-19 Vaccine  Discontinued    Health Maintenance  Health Maintenance Due  Topic Date Due   Medicare Annual Wellness (AWV)  Never done    Colorectal cancer screening: No longer required.   Lung Cancer Screening: (Low Dose CT Chest recommended if Age 34-80 years, 30 pack-year currently smoking OR have quit w/in 15years.) does not qualify.   Lung Cancer Screening Referral: no  Additional Screening:  Hepatitis C Screening: does not qualify;   Vision Screening: Recommended annual ophthalmology exams for early detection of glaucoma and other disorders of the eye. Is the patient up to date with their annual eye exam?  Yes  Who is the provider or what is the name of the office in which the patient attends annual eye exams? [facility] If pt is not established with a provider, would they like to be referred to a provider to establish care? No .   Dental Screening: Recommended annual dental exams for proper oral hygiene  Community Resource Referral / Chronic Care Management: CRR required this visit?  No   CCM required this visit?  No      Plan:     I have personally reviewed and noted the following in the patient's chart:   Medical and social history Use of alcohol, tobacco or illicit drugs  Current medications and supplements including opioid prescriptions. Patient is not currently taking opioid prescriptions. Functional ability and status Nutritional status Physical activity Advanced directives List of other physicians Hospitalizations,  surgeries, and ER visits in previous 12 months Vitals Screenings to include cognitive, depression, and falls Referrals and appointments  In addition, I have reviewed and  discussed with patient certain preventive protocols, quality metrics, and best practice recommendations. A written personalized care plan for preventive services as well as general preventive health recommendations were provided to patient.     Kellie Simmering, LPN   [id number]   Nurse Notes: none  Due to this being a virtual visit, the after visit summary with patients personalized plan was offered to patient via mail or my-chart. to pick up at office at next visit

## 2022-08-26 NOTE — Patient Instructions (Signed)
Mr. Jose Vaughn , Thank you for taking time to come for your Medicare Wellness Visit. I appreciate your ongoing commitment to your health goals. Please review the following plan we discussed and let me know if I can assist you in the future.   These are the goals we discussed:  Goals      Patient Stated     08/26/2022, no goals     Track and Manage My Blood Pressure-Hypertension     Timeframe:  Long-Range Goal Priority:  High Start Date: 04/09/2021                             Expected End Date: 04/09/2022                      Follow Up within 90 days   - check blood pressure weekly    Why is this important?   You won't feel high blood pressure, but it can still hurt your blood vessels.  High blood pressure can cause heart or kidney problems. It can also cause a stroke.  Making lifestyle changes like losing a little weight or eating less salt will help.  Checking your blood pressure at home and at different times of the day can help to control blood pressure.  If the doctor prescribes medicine remember to take it the way the doctor ordered.  Call the office if you cannot afford the medicine or if there are questions about it.     Notes:         This is a list of the screening recommended for you and due dates:  Health Maintenance  Topic Date Due   Pneumonia Vaccine (2 of 2 - PPSV23 or PCV20) 06/12/2023*   Zoster (Shingles) Vaccine (1 of 2) 09/10/2023*   Medicare Annual Wellness Visit  08/27/2023   Flu Shot  Completed   HPV Vaccine  Aged Out   DTaP/Tdap/Td vaccine  Discontinued   COVID-19 Vaccine  Discontinued  *Topic was postponed. The date shown is not the original due date.    Advanced directives: Advance directive discussed with you today.   Conditions/risks identified: none  Next appointment: Follow up in one year for your annual wellness visit.   Preventive Care 81 Years and Older, Male  Preventive care refers to lifestyle choices and visits with your health care  provider that can promote health and wellness. What does preventive care include? A yearly physical exam. This is also called an annual well check. Dental exams once or twice a year. Routine eye exams. Ask your health care provider how often you should have your eyes checked. Personal lifestyle choices, including: Daily care of your teeth and gums. Regular physical activity. Eating a healthy diet. Avoiding tobacco and drug use. Limiting alcohol use. Practicing safe sex. Taking low doses of aspirin every day. Taking vitamin and mineral supplements as recommended by your health care provider. What happens during an annual well check? The services and screenings done by your health care provider during your annual well check will depend on your age, overall health, lifestyle risk factors, and family history of disease. Counseling  Your health care provider may ask you questions about your: Alcohol use. Tobacco use. Drug use. Emotional well-being. Home and relationship well-being. Sexual activity. Eating habits. History of falls. Memory and ability to understand (cognition). Work and work Statistician. Screening  You may have the following tests or measurements: Height, weight, and  BMI. Blood pressure. Lipid and cholesterol levels. These may be checked every 5 years, or more frequently if you are over 28 years old. Skin check. Lung cancer screening. You may have this screening every year starting at age 65 if you have a 30-pack-year history of smoking and currently smoke or have quit within the past 15 years. Fecal occult blood test (FOBT) of the stool. You may have this test every year starting at age 80. Flexible sigmoidoscopy or colonoscopy. You may have a sigmoidoscopy every 5 years or a colonoscopy every 10 years starting at age 34. Prostate cancer screening. Recommendations will vary depending on your family history and other risks. Hepatitis C blood test. Hepatitis B blood  test. Sexually transmitted disease (STD) testing. Diabetes screening. This is done by checking your blood sugar (glucose) after you have not eaten for a while (fasting). You may have this done every 1-3 years. Abdominal aortic aneurysm (AAA) screening. You may need this if you are a current or former smoker. Osteoporosis. You may be screened starting at age 49 if you are at high risk. Talk with your health care provider about your test results, treatment options, and if necessary, the need for more tests. Vaccines  Your health care provider may recommend certain vaccines, such as: Influenza vaccine. This is recommended every year. Tetanus, diphtheria, and acellular pertussis (Tdap, Td) vaccine. You may need a Td booster every 10 years. Zoster vaccine. You may need this after age 43. Pneumococcal 13-valent conjugate (PCV13) vaccine. One dose is recommended after age 10. Pneumococcal polysaccharide (PPSV23) vaccine. One dose is recommended after age 47. Talk to your health care provider about which screenings and vaccines you need and how often you need them. This information is not intended to replace advice given to you by your health care provider. Make sure you discuss any questions you have with your health care provider. Document Released: 07/21/2015 Document Revised: 03/13/2016 Document Reviewed: 04/25/2015 Elsevier Interactive Patient Education  2017 [place] Prevention in the Home Falls can cause injuries. They can happen to people of all ages. There are many things you can do to make your home safe and to help prevent falls. What can I do on the outside of my home? Regularly fix the edges of walkways and driveways and fix any cracks. Remove anything that might make you trip as you walk through a door, such as a raised step or threshold. Trim any bushes or trees on the path to your home. Use bright outdoor lighting. Clear any walking paths of anything that might make  someone trip, such as rocks or tools. Regularly check to see if handrails are loose or broken. Make sure that both sides of any steps have handrails. Any raised decks and porches should have guardrails on the edges. Have any leaves, snow, or ice cleared regularly. Use sand or salt on walking paths during winter. Clean up any spills in your garage right away. This includes oil or grease spills. What can I do in the bathroom? Use night lights. Install grab bars by the toilet and in the tub and shower. Do not use towel bars as grab bars. Use non-skid mats or decals in the tub or shower. If you need to sit down in the shower, use a plastic, non-slip stool. Keep the floor dry. Clean up any water that spills on the floor as soon as it happens. Remove soap buildup in the tub or shower regularly. Attach bath mats securely with double-sided  non-slip rug tape. Do not have throw rugs and other things on the floor that can make you trip. What can I do in the bedroom? Use night lights. Make sure that you have a light by your bed that is easy to reach. Do not use any sheets or blankets that are too big for your bed. They should not hang down onto the floor. Have a firm chair that has side arms. You can use this for support while you get dressed. Do not have throw rugs and other things on the floor that can make you trip. What can I do in the kitchen? Clean up any spills right away. Avoid walking on wet floors. Keep items that you use a lot in easy-to-reach places. If you need to reach something above you, use a strong step stool that has a grab bar. Keep electrical cords out of the way. Do not use floor polish or wax that makes floors slippery. If you must use wax, use non-skid floor wax. Do not have throw rugs and other things on the floor that can make you trip. What can I do with my stairs? Do not leave any items on the stairs. Make sure that there are handrails on both sides of the stairs and  use them. Fix handrails that are broken or loose. Make sure that handrails are as long as the stairways. Check any carpeting to make sure that it is firmly attached to the stairs. Fix any carpet that is loose or worn. Avoid having throw rugs at the top or bottom of the stairs. If you do have throw rugs, attach them to the floor with carpet tape. Make sure that you have a light switch at the top of the stairs and the bottom of the stairs. If you do not have them, ask someone to add them for you. What else can I do to help prevent falls? Wear shoes that: Do not have high heels. Have rubber bottoms. Are comfortable and fit you well. Are closed at the toe. Do not wear sandals. If you use a stepladder: Make sure that it is fully opened. Do not climb a closed stepladder. Make sure that both sides of the stepladder are locked into place. Ask someone to hold it for you, if possible. Clearly mark and make sure that you can see: Any grab bars or handrails. First and last steps. Where the edge of each step is. Use tools that help you move around (mobility aids) if they are needed. These include: Canes. Walkers. Scooters. Crutches. Turn on the lights when you go into a dark area. Replace any light bulbs as soon as they burn out. Set up your furniture so you have a clear path. Avoid moving your furniture around. If any of your floors are uneven, fix them. If there are any pets around you, be aware of where they are. Review your medicines with your doctor. Some medicines can make you feel dizzy. This can increase your chance of falling. Ask your doctor what other things that you can do to help prevent falls. This information is not intended to replace advice given to you by your health care provider. Make sure you discuss any questions you have with your health care provider. Document Released: 04/20/2009 Document Revised: 11/30/2015 Document Reviewed: 07/29/2014 Elsevier Interactive Patient  Education  2017 [employer].

## 2022-10-10 ENCOUNTER — Other Ambulatory Visit: Payer: Self-pay | Admitting: Family Medicine

## 2022-10-10 ENCOUNTER — Other Ambulatory Visit: Payer: Self-pay | Admitting: Nurse Practitioner

## 2022-10-10 DIAGNOSIS — I10 Essential (primary) hypertension: Secondary | ICD-10-CM

## 2023-01-03 DIAGNOSIS — H53453 Other localized visual field defect, bilateral: Secondary | ICD-10-CM | POA: Diagnosis not present

## 2023-01-03 DIAGNOSIS — D352 Benign neoplasm of pituitary gland: Secondary | ICD-10-CM | POA: Diagnosis not present

## 2023-01-06 IMAGING — DX DG CHEST 2V
2 series · 2 of 2 positions shown · non-contrast
Comparison: 04/18/2020

CLINICAL DATA: Shortness of breath, smoker

EXAM:
CHEST - 2 VIEW

[chest pa]
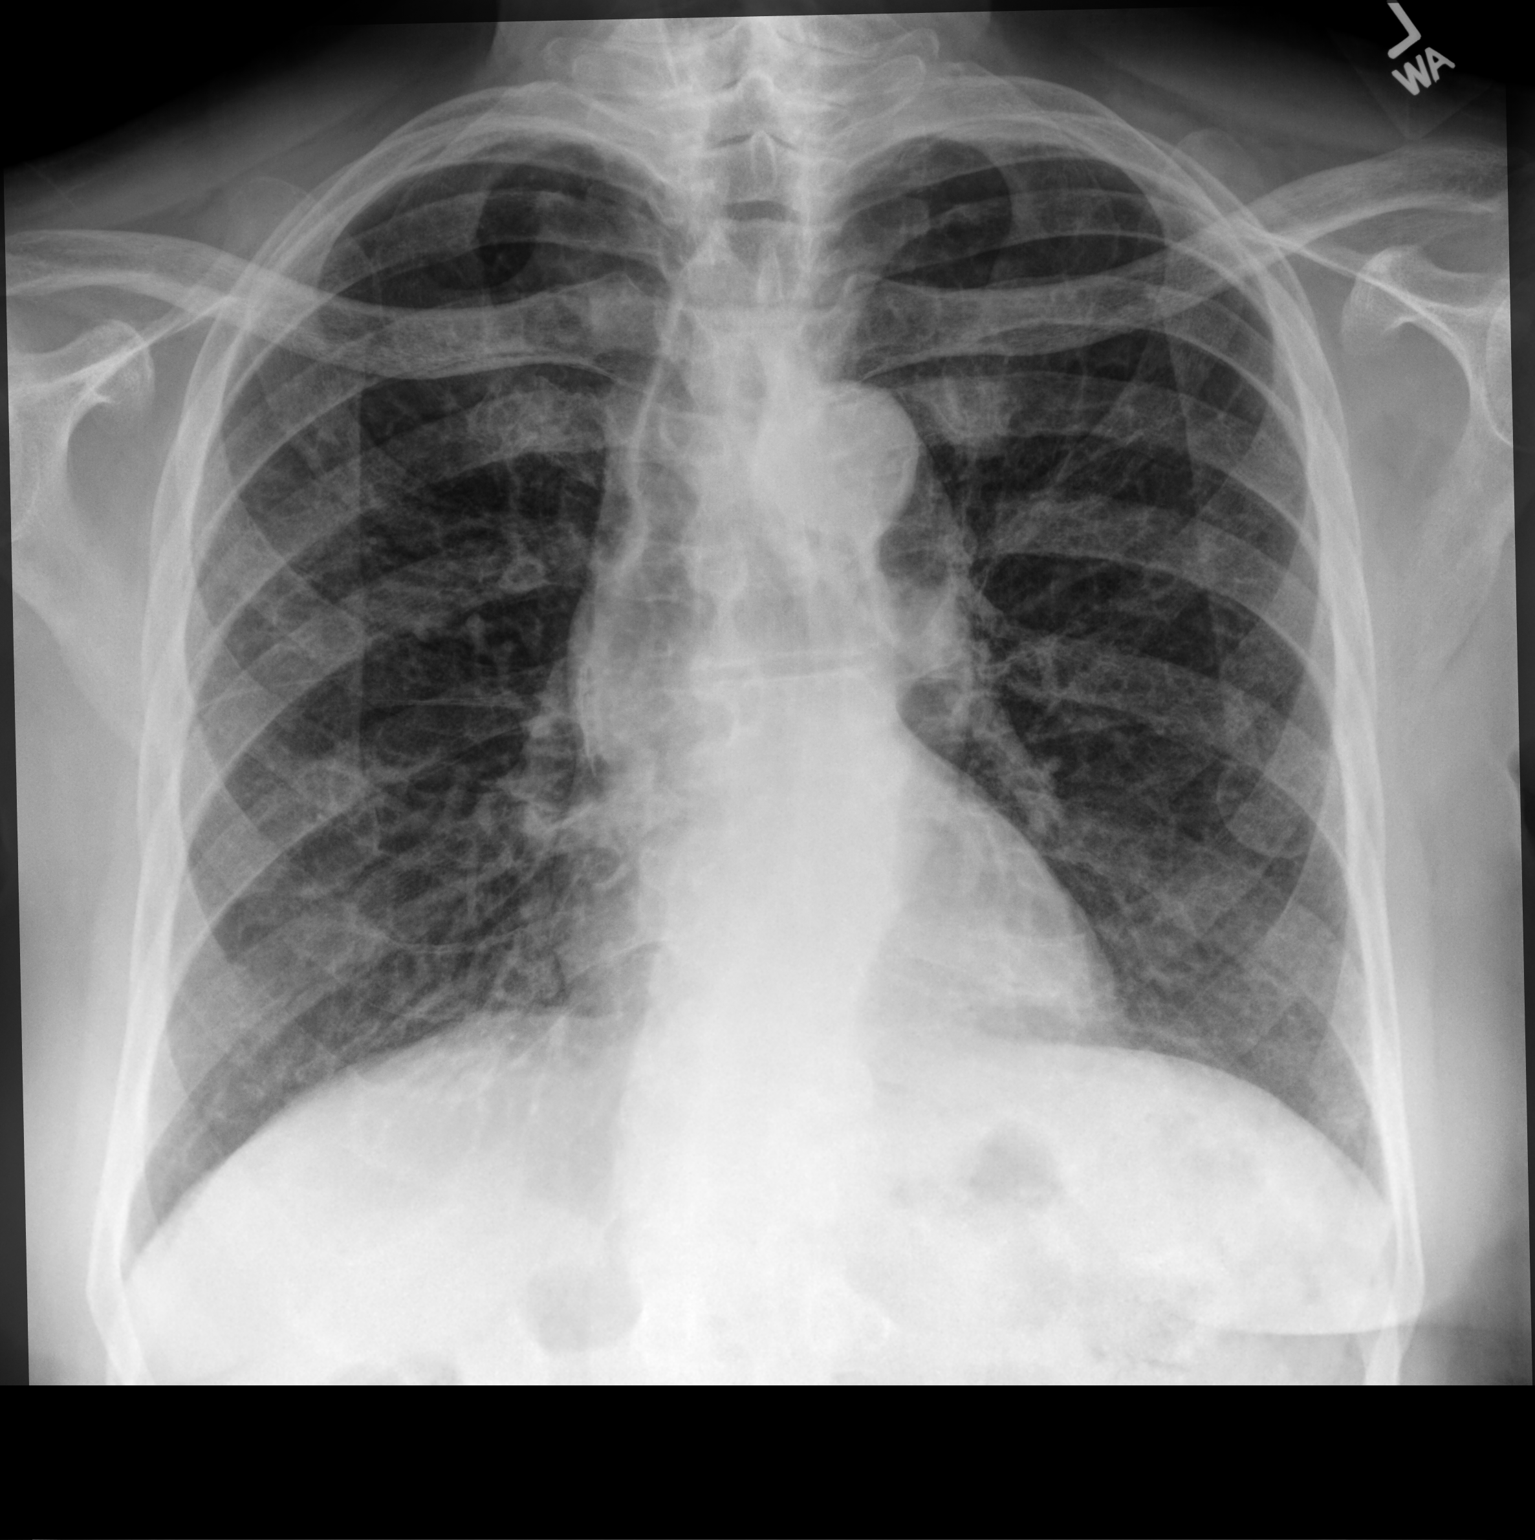

[chest lat]
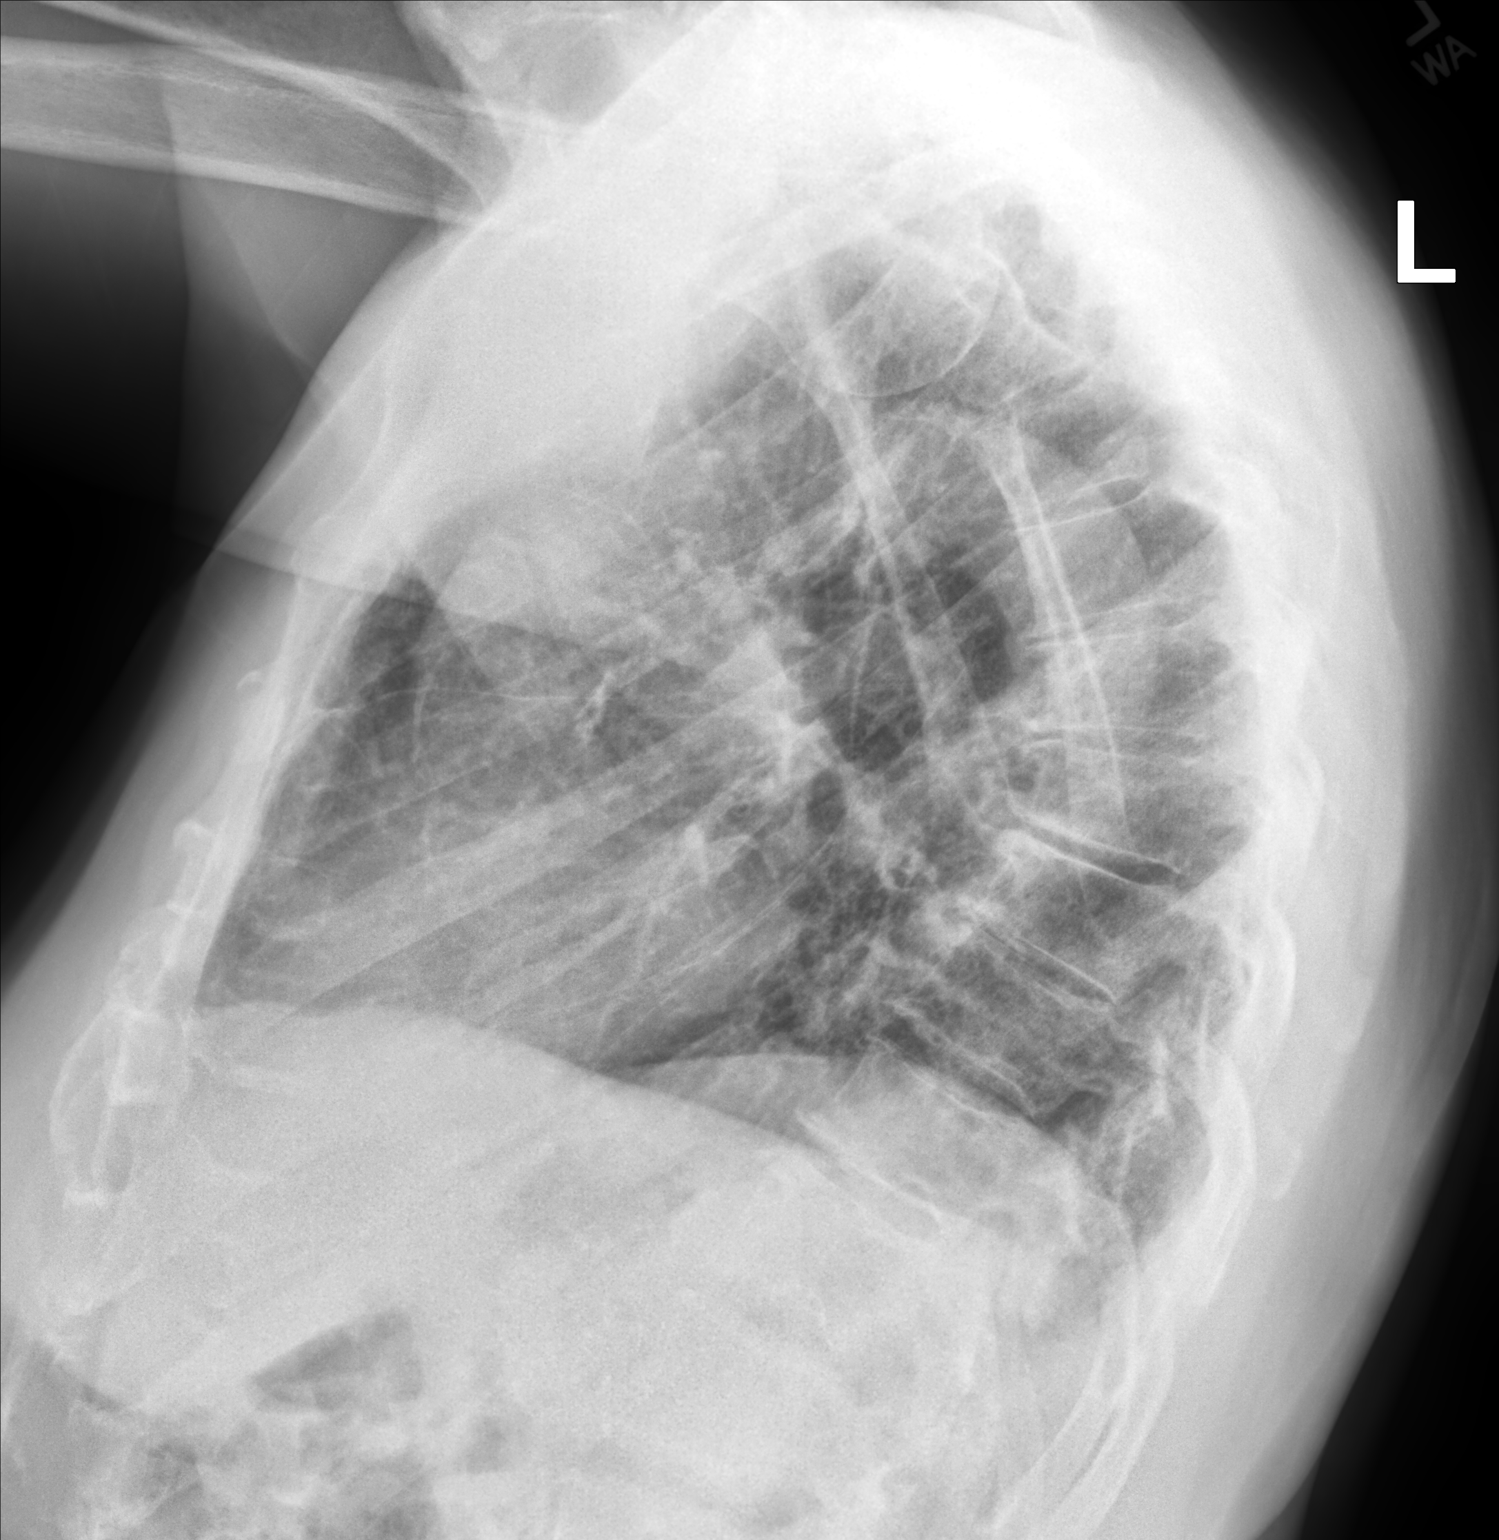

[2 of 2 positions shown; findings below may reference images not displayed]

FINDINGS: Normal heart size, mediastinal contours, and pulmonary vascularity.

Atherosclerotic calcification aorta.

Chronic interstitial prominence stable.

No acute infiltrate, pleural effusion, or pneumothorax.

Scattered endplate spur formation thoracic spine.
IMPRESSION: Mild chronic interstitial lung disease changes.

No acute abnormalities.

Aortic Atherosclerosis ([id number].H).

## 2023-02-24 IMAGING — CT CT CHEST HIGH RESOLUTION
2 of 7 series · 11 of 36 positions shown, 13 images · non-contrast
Comparison: No priors.

CLINICAL DATA: 80-year-old male with history of shortness of breath
for the past several months. Former smoker.

EXAM:
CT CHEST WITHOUT CONTRAST
TECHNIQUE: Multidetector CT imaging of the chest was performed following the
standard protocol without intravenous contrast. High resolution
imaging of the lungs, as well as inspiratory and expiratory imaging,
was performed.

[Series 4: chest 2.00 br36 s3 cor soft · coronal · 0.58mm/px · 3 of 171 slices shown]
[im 35/171  lung]
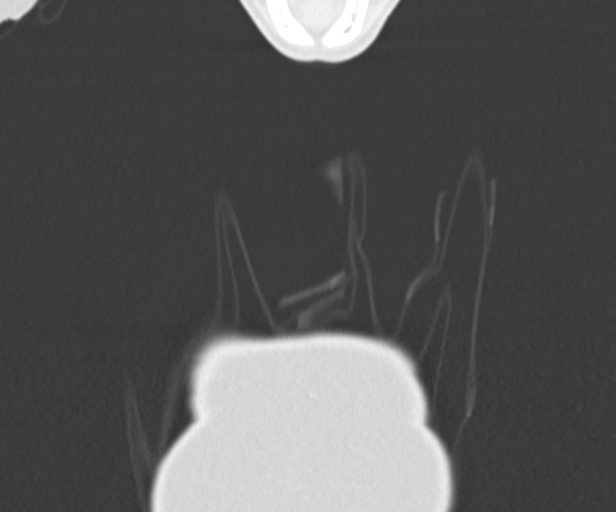
[im 69/171  lung]
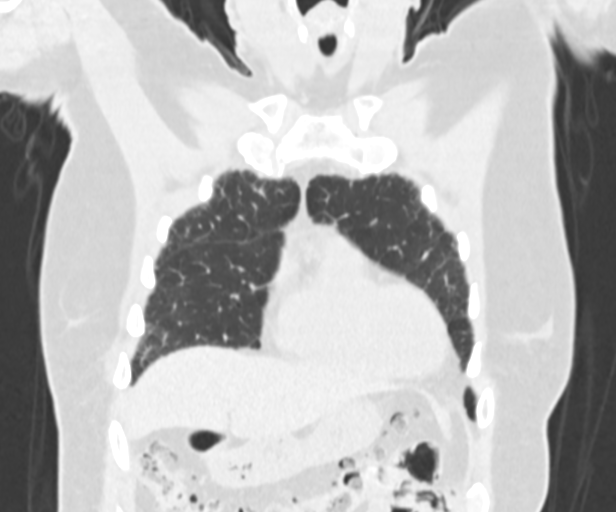
[im 103/171  lung]
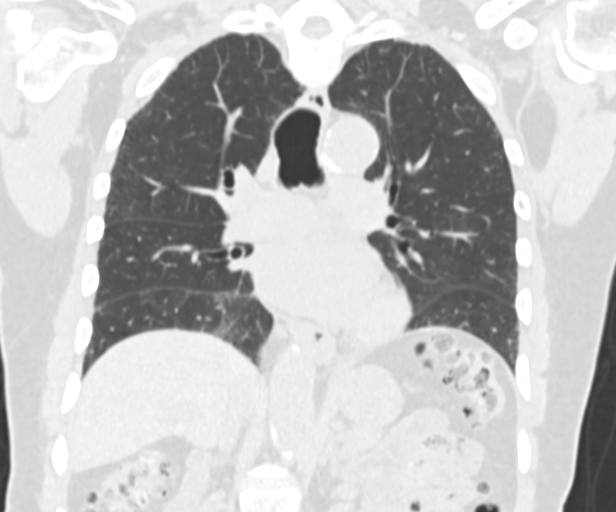

[Series 11: chest 1.00 br60 s3 high res thins 1x1 mm · axial · 0.54mm/px · z∈[+1647,+1886]mm · 8 of 287 slices shown, 10 images]
[im 24/287  mediastinal]
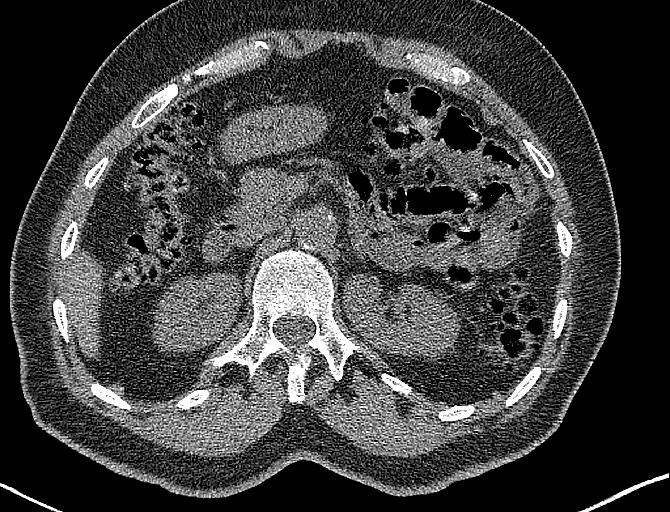
[im 24/287  lung]
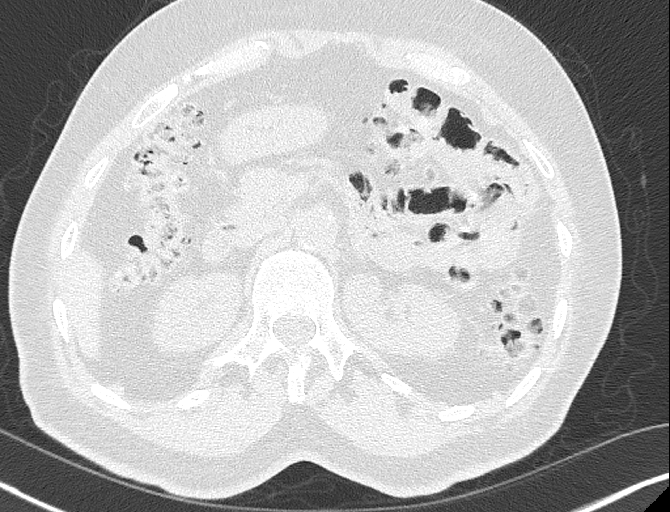
[im 72/287  lung]
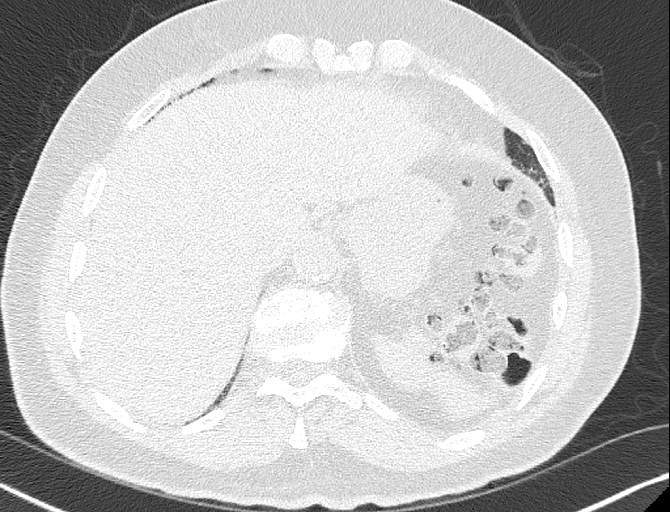
[im 96/287  lung]
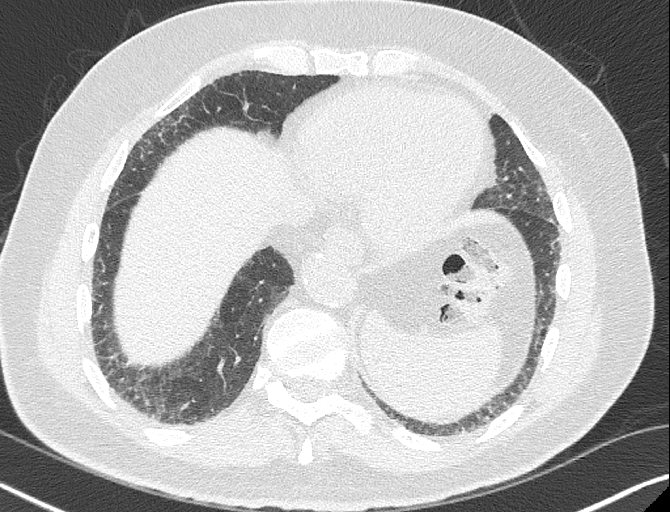
[im 120/287  lung]
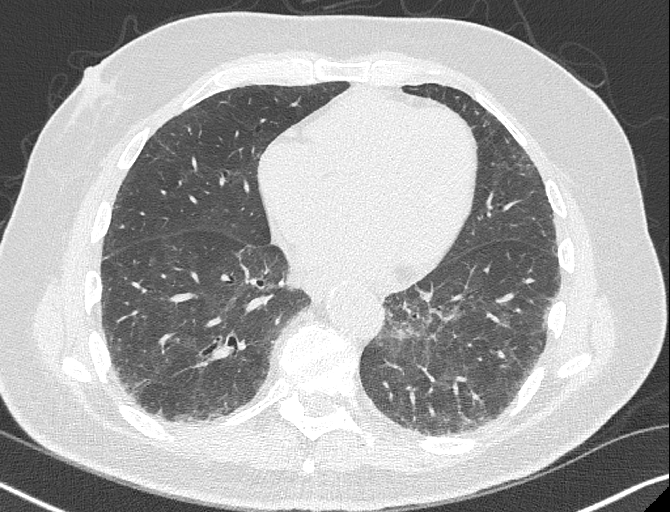
[im 167/287  mediastinal]
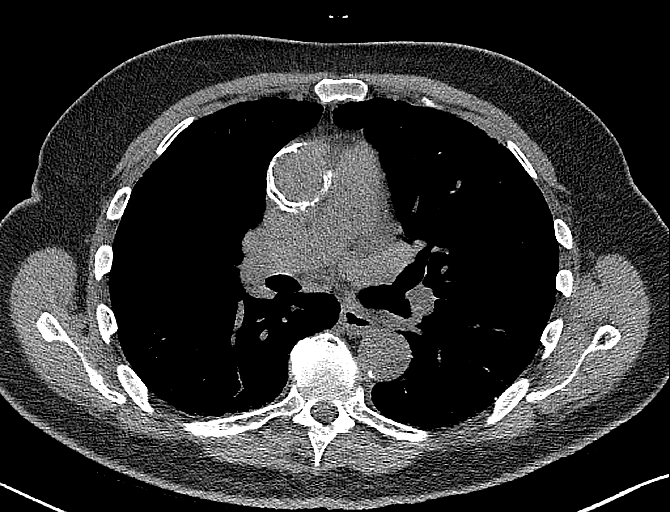
[im 167/287  lung]
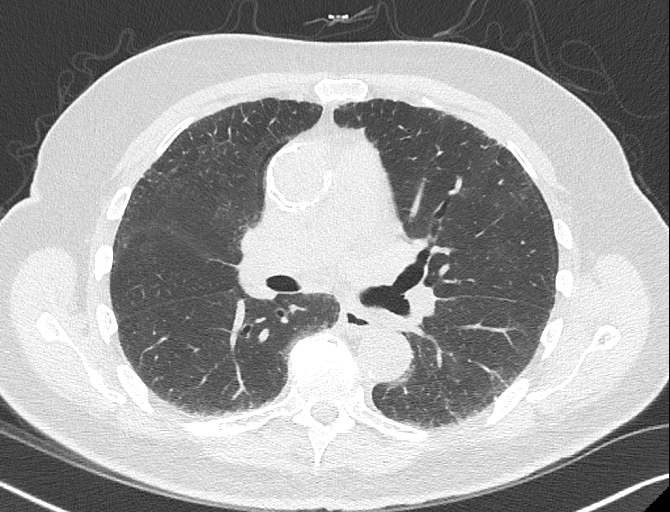
[im 191/287  lung]
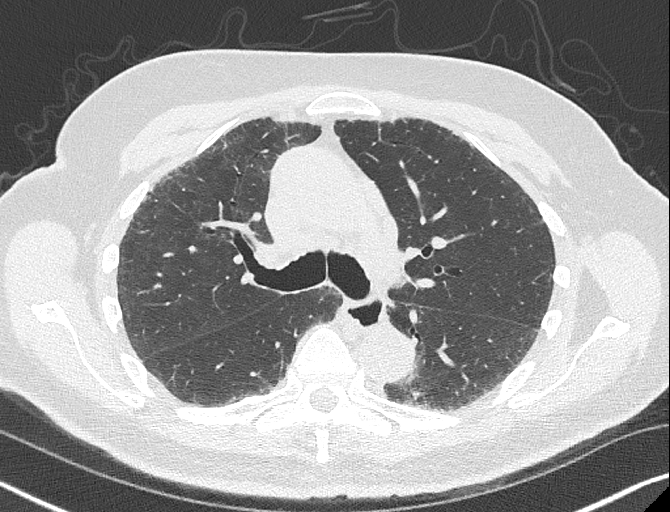
[im 215/287  lung]
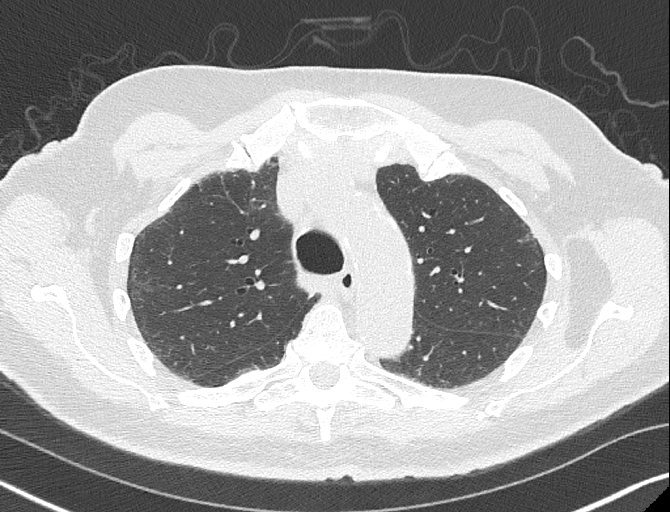
[im 263/287  lung]
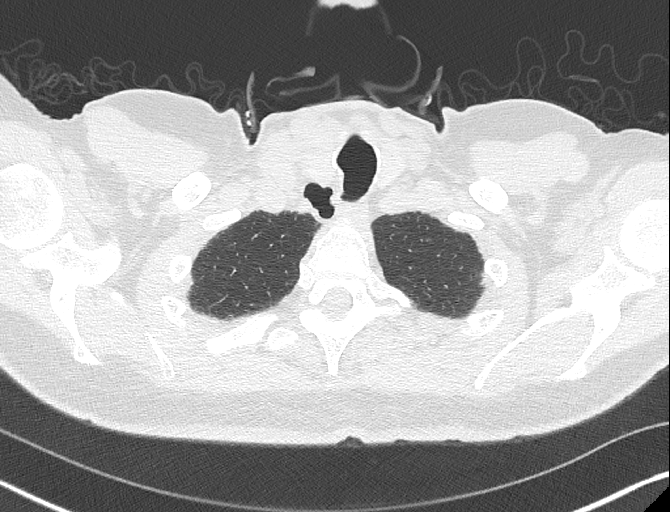

[11 of 36 positions shown; findings below may reference images not displayed]

FINDINGS: Cardiovascular: Heart size is normal. There is no significant
pericardial fluid, thickening or pericardial calcification. There is
aortic atherosclerosis, as well as atherosclerosis of the great
vessels of the mediastinum and the coronary arteries, including
calcified atherosclerotic plaque in the left main, left anterior
descending, left circumflex and right coronary arteries.

Mediastinum/Nodes: No pathologically enlarged mediastinal or hilar
lymph nodes. Please note that accurate exclusion of hilar adenopathy
is limited on noncontrast CT scans. Gas containing structure
posterolateral to the proximal trachea, presumably a small tracheal
diverticulum. Esophagus is unremarkable in appearance. No axillary
lymphadenopathy.

Lungs/Pleura: High-resolution images demonstrates some very mild
areas of peripheral predominant ground-glass attenuation, septal
thickening and mild subpleural reticulation, most evident throughout
the mid to lower lungs bilaterally. No definitive traction
bronchiectasis, peripheral bronchiolectasis or frank honeycombing.
Inspiratory and expiratory imaging demonstrates some mild air
trapping indicative of mild small airways disease. No acute
consolidative airspace disease. No pleural effusions. No suspicious
appearing pulmonary nodules or masses are noted.

Upper Abdomen: Extensive colonic diverticulosis noted in the
visualized portions of the abdomen. Atherosclerotic calcifications
in the abdominal aorta.

Musculoskeletal: There are no aggressive appearing lytic or blastic
lesions noted in the visualized portions of the skeleton.
IMPRESSION: 1. There are findings in the lungs compatible with interstitial lung
disease, with a spectrum of findings considered indeterminate for
usual interstitial pneumonia (UIP) per current ATS guidelines. At
this time, findings are favored to reflect mild nonspecific
interstitial pneumonia (NSIP), however, repeat high-resolution chest
CT is recommended in 12 months to assess for temporal changes in the
appearance of the lung parenchyma.
2. Aortic atherosclerosis, in addition to left main and three-vessel
coronary artery disease. Assessment for potential risk factor
modification, dietary therapy or pharmacologic therapy may be
warranted, if clinically indicated.
3. Small tracheal diverticulum, as above.
4. Colonic diverticulosis without evidence of acute diverticulitis
at this time.

Aortic Atherosclerosis ([id number]-9Z3.3).

## 2023-05-31 ENCOUNTER — Other Ambulatory Visit: Payer: Self-pay | Admitting: Family Medicine

## 2023-05-31 DIAGNOSIS — E7800 Pure hypercholesterolemia, unspecified: Secondary | ICD-10-CM

## 2023-06-04 ENCOUNTER — Other Ambulatory Visit: Payer: Self-pay | Admitting: Family Medicine

## 2023-06-04 DIAGNOSIS — E7800 Pure hypercholesterolemia, unspecified: Secondary | ICD-10-CM

## 2023-06-09 NOTE — Progress Notes (Signed)
   06/09/2023  Patient ID: Jose Vaughn, male   DOB: 1941-02-17, 82 y.o.   MRN: [id number]  Pharmacy Quality Measure Review  This patient is appearing on a report for being at risk of failing the adherence measure for cholesterol (statin) medications this calendar year.   Medication: atorvastatin 80mg  Last fill date: 06/09/23 for 90 day supply  Insurance report was not up to date. No action needed at this time.   Lenna Gilford, PharmD, DPLA

## 2023-06-12 ENCOUNTER — Telehealth: Payer: Self-pay | Admitting: Family Medicine

## 2023-06-12 ENCOUNTER — Encounter: Payer: Medicare Other | Admitting: Family Medicine

## 2023-06-12 NOTE — Telephone Encounter (Signed)
Pt came in for a cpe on 06/12/23 with Dr Doreene Burke, he left without being seen. He last cpe was on 06/11/22. He was unsure if his cpe didn't have to be a yr plus one day. To be on the safe side, he rescheduled for 06/16/23.

## 2023-06-16 ENCOUNTER — Encounter: Payer: Self-pay | Admitting: Family Medicine

## 2023-06-16 ENCOUNTER — Ambulatory Visit (INDEPENDENT_AMBULATORY_CARE_PROVIDER_SITE_OTHER): Payer: Medicare Other | Admitting: Family Medicine

## 2023-06-16 VITALS — BP 158/80 | HR 54 | Temp 97.7°F | Ht 66.0 in | Wt 165.0 lb

## 2023-06-16 DIAGNOSIS — E7800 Pure hypercholesterolemia, unspecified: Secondary | ICD-10-CM | POA: Diagnosis not present

## 2023-06-16 DIAGNOSIS — D352 Benign neoplasm of pituitary gland: Secondary | ICD-10-CM | POA: Diagnosis not present

## 2023-06-16 DIAGNOSIS — Z23 Encounter for immunization: Secondary | ICD-10-CM

## 2023-06-16 DIAGNOSIS — Z131 Encounter for screening for diabetes mellitus: Secondary | ICD-10-CM | POA: Diagnosis not present

## 2023-06-16 DIAGNOSIS — I10 Essential (primary) hypertension: Secondary | ICD-10-CM

## 2023-06-16 MED ORDER — ATORVASTATIN CALCIUM 80 MG PO TABS: 80.0000 mg | ORAL_TABLET | Freq: Every day | ORAL | 3 refills | Status: DC

## 2023-06-16 NOTE — Progress Notes (Signed)
Established Patient Office Visit   Subjective:  Patient ID: Jose Vaughn, male    DOB: [birth year]  Age: 82 y.o. MRN: [id number]  Chief Complaint  Patient presents with   Annual Exam    CPE. Pt is fasting.     HPI Encounter Diagnoses  Name Primary?   Essential hypertension Yes   Influenza vaccine needed    Elevated serum cholesterol    Screening for diabetes mellitus    Pituitary adenoma (HCC)    For follow-up on the above.  Continues high-dose atorvastatin for elevated cholesterol.  Continues amlodipine 5 mg and metoprolol XL 25 mg for hypertension.   Review of Systems  Constitutional: Negative.   HENT: Negative.    Eyes:  Negative for blurred vision, discharge and redness.  Respiratory: Negative.    Cardiovascular: Negative.   Gastrointestinal:  Negative for abdominal pain.  Genitourinary: Negative.   Musculoskeletal: Negative.  Negative for myalgias.  Skin:  Negative for rash.  Neurological:  Negative for tingling, loss of consciousness and weakness.  Endo/Heme/Allergies:  Negative for polydipsia.     Current Outpatient Medications:    amLODipine (NORVASC) 5 MG tablet, TAKE 1 TABLET(5 MG) BY MOUTH DAILY, Disp: 90 tablet, Rfl: 3   aspirin EC 81 MG tablet, Take 81 mg by mouth daily., Disp: , Rfl:    metoprolol succinate (TOPROL-XL) 25 MG 24 hr tablet, TAKE 1 TABLET(25 MG) BY MOUTH DAILY, Disp: 90 tablet, Rfl: 3   atorvastatin (LIPITOR) 80 MG tablet, Take 1 tablet (80 mg total) by mouth daily., Disp: 90 tablet, Rfl: 3   Objective:     BP (!) 158/80   Pulse (!) 54   Temp 97.7 F (36.5 C)   Ht 5\' 6"  (1.676 m)   Wt 165 lb (74.8 kg)   SpO2 99%   BMI 26.63 kg/m    Physical Exam Constitutional:      General: He is not in acute distress.    Appearance: Normal appearance. He is not ill-appearing, toxic-appearing or diaphoretic.  HENT:     Head: Normocephalic and atraumatic.     Right Ear: External ear normal.     Left Ear: External ear normal.      Mouth/Throat:     Mouth: Mucous membranes are moist.     Pharynx: Oropharynx is clear. No oropharyngeal exudate or posterior oropharyngeal erythema.  Eyes:     General: No scleral icterus.       Right eye: No discharge.        Left eye: No discharge.     Extraocular Movements: Extraocular movements intact.     Conjunctiva/sclera: Conjunctivae normal.     Pupils: Pupils are equal, round, and reactive to light.  Cardiovascular:     Rate and Rhythm: Normal rate and regular rhythm.  Pulmonary:     Effort: Pulmonary effort is normal. No respiratory distress.     Breath sounds: Normal breath sounds.  Abdominal:     Tenderness: There is no right CVA tenderness or left CVA tenderness.  Musculoskeletal:     Cervical back: No rigidity or tenderness.  Skin:    General: Skin is warm and dry.  Neurological:     Mental Status: He is alert and oriented to person, place, and time.  Psychiatric:        Mood and Affect: Mood normal.        Behavior: Behavior normal.   Relevant well   No results found for any visits on 06/16/23.  The ASCVD Risk score (Arnett DK, et al., 2019) failed to calculate for the following reasons:   The 2019 ASCVD risk score is only valid for ages 52 to 70   The patient has a prior MI or stroke diagnosis    Assessment & Plan:   Essential hypertension -     CBC with Differential/Platelet -     Comprehensive metabolic panel -     Urinalysis, Routine w reflex microscopic  Influenza vaccine needed -     Flu Vaccine Trivalent High Dose (Fluad)  Elevated serum cholesterol -     Lipid panel -     Atorvastatin Calcium; Take 1 tablet (80 mg total) by mouth daily.  Dispense: 90 tablet; Refill: 3  Screening for diabetes mellitus -     Hemoglobin A1c  Pituitary adenoma (HCC) -     Ambulatory referral to Neurology    Return in about 8 weeks (around 08/11/2023), or Check and record blood pressures every other day..   Information was given on managing  hypertension.  Continue all medicines as above. Mliss Sax, MD

## 2023-06-17 LAB — URINALYSIS, ROUTINE W REFLEX MICROSCOPIC
Bilirubin Urine: NEGATIVE
Ketones, ur: NEGATIVE
Leukocytes,Ua: NEGATIVE
Nitrite: NEGATIVE
Specific Gravity, Urine: 1.025 (ref 1.000–1.030)
Total Protein, Urine: NEGATIVE
Urine Glucose: NEGATIVE
Urobilinogen, UA: 0.2 (ref 0.0–1.0)
pH: 6 (ref 5.0–8.0)

## 2023-06-17 LAB — COMPREHENSIVE METABOLIC PANEL
ALT: 11 U/L (ref 0–53)
AST: 23 U/L (ref 0–37)
Albumin: 4 g/dL (ref 3.5–5.2)
Alkaline Phosphatase: 98 U/L (ref 39–117)
BUN: 21 mg/dL (ref 6–23)
CO2: 28 meq/L (ref 19–32)
Calcium: 8.8 mg/dL (ref 8.4–10.5)
Chloride: 104 meq/L (ref 96–112)
Creatinine, Ser: 1.25 mg/dL (ref 0.40–1.50)
GFR: 54 mL/min — ABNORMAL LOW (ref >60.00)
Glucose, Bld: 89 mg/dL (ref 70–99)
Potassium: 3.8 meq/L (ref 3.5–5.1)
Sodium: 141 meq/L (ref 135–145)
Total Bilirubin: 0.9 mg/dL (ref 0.2–1.2)
Total Protein: 7.6 g/dL (ref 6.0–8.3)

## 2023-06-17 LAB — LIPID PANEL
Cholesterol: 152 mg/dL (ref 0–200)
HDL: 35.6 mg/dL — ABNORMAL LOW (ref >39.00)
LDL Cholesterol: 94 mg/dL (ref 0–99)
NonHDL: 116.67
Total CHOL/HDL Ratio: 4
Triglycerides: 112 mg/dL (ref 0.0–149.0)
VLDL: 22.4 mg/dL (ref 0.0–40.0)

## 2023-06-17 LAB — CBC WITH DIFFERENTIAL/PLATELET
Basophils Absolute: 0.1 10*3/uL (ref 0.0–0.1)
Basophils Relative: 1.3 % (ref 0.0–3.0)
Eosinophils Absolute: 0.8 10*3/uL — ABNORMAL HIGH (ref 0.0–0.7)
Eosinophils Relative: 9.4 % — ABNORMAL HIGH (ref 0.0–5.0)
HCT: 36.8 % — ABNORMAL LOW (ref 39.0–52.0)
Hemoglobin: 12.2 g/dL — ABNORMAL LOW (ref 13.0–17.0)
Lymphocytes Relative: 35.5 % (ref 12.0–46.0)
Lymphs Abs: 2.9 10*3/uL (ref 0.7–4.0)
MCHC: 33.1 g/dL (ref 30.0–36.0)
MCV: 91.4 fL (ref 78.0–100.0)
Monocytes Absolute: 0.5 10*3/uL (ref 0.1–1.0)
Monocytes Relative: 6.4 % (ref 3.0–12.0)
Neutro Abs: 3.9 10*3/uL (ref 1.4–7.7)
Neutrophils Relative %: 47.4 % (ref 43.0–77.0)
Platelets: 189 10*3/uL (ref 150.0–400.0)
RBC: 4.02 Mil/uL — ABNORMAL LOW (ref 4.22–5.81)
RDW: 13.9 % (ref 11.5–15.5)
WBC: 8.2 10*3/uL (ref 4.0–10.5)

## 2023-06-17 LAB — HEMOGLOBIN A1C: Hgb A1c MFr Bld: 6.2 % (ref 4.6–6.5)

## 2023-06-17 NOTE — Telephone Encounter (Signed)
 Not counted as no show

## 2023-06-22 NOTE — Progress Notes (Signed)
Blood work revealed that patient has prediabetes.  There is a small amount of blood that persishists in his urine.  Recent CT scan of abdomen and pelvis was negative for an obvious source regarding the blood in his urine.  Please schedule him to see me here in the clinic so we can discuss this and  prediabetes.

## 2023-07-04 ENCOUNTER — Ambulatory Visit (INDEPENDENT_AMBULATORY_CARE_PROVIDER_SITE_OTHER): Payer: Medicare Other | Admitting: Family Medicine

## 2023-07-04 ENCOUNTER — Encounter: Payer: Self-pay | Admitting: Family Medicine

## 2023-07-04 VITALS — BP 136/70 | HR 57 | Temp 97.5°F | Ht 66.0 in | Wt 164.4 lb

## 2023-07-04 DIAGNOSIS — R3129 Other microscopic hematuria: Secondary | ICD-10-CM | POA: Diagnosis not present

## 2023-07-04 DIAGNOSIS — R7303 Prediabetes: Secondary | ICD-10-CM | POA: Insufficient documentation

## 2023-07-04 NOTE — Progress Notes (Signed)
Established Patient Office Visit   Subjective:  Patient ID: Jose Vaughn, male    DOB: [birth year]  Age: 82 y.o. MRN: [id number]  Chief Complaint  Patient presents with   Medical Management of Chronic Issues    Discuss labs and blood in urine.     HPI Encounter Diagnoses  Name Primary?   Prediabetes Yes   Microhematuria    For follow-up of above.  No history of diabetes.  Does not remember family history of diabetes.  No history of gross hematuria.  Quit tobacco 40 years ago.   Review of Systems  Constitutional: Negative.   HENT: Negative.    Eyes:  Negative for blurred vision, discharge and redness.  Respiratory: Negative.    Cardiovascular: Negative.   Gastrointestinal:  Negative for abdominal pain.  Genitourinary: Negative.  Negative for hematuria.  Musculoskeletal: Negative.  Negative for myalgias.  Skin:  Negative for rash.  Neurological:  Negative for tingling, loss of consciousness and weakness.  Endo/Heme/Allergies:  Negative for polydipsia.     Current Outpatient Medications:    amLODipine (NORVASC) 5 MG tablet, TAKE 1 TABLET(5 MG) BY MOUTH DAILY, Disp: 90 tablet, Rfl: 3   aspirin EC 81 MG tablet, Take 81 mg by mouth daily., Disp: , Rfl:    atorvastatin (LIPITOR) 80 MG tablet, Take 1 tablet (80 mg total) by mouth daily., Disp: 90 tablet, Rfl: 3   metoprolol succinate (TOPROL-XL) 25 MG 24 hr tablet, TAKE 1 TABLET(25 MG) BY MOUTH DAILY, Disp: 90 tablet, Rfl: 3   Objective:     BP 136/70   Pulse (!) 57   Temp (!) 97.5 F (36.4 C)   Ht 5\' 6"  (1.676 m)   Wt 164 lb 6.4 oz (74.6 kg)   SpO2 99%   BMI 26.53 kg/m    Physical Exam Constitutional:      General: He is not in acute distress.    Appearance: Normal appearance. He is not ill-appearing, toxic-appearing or diaphoretic.  HENT:     Head: Normocephalic and atraumatic.     Right Ear: External ear normal.     Left Ear: External ear normal.  Eyes:     General: No scleral icterus.       Right eye: No  discharge.        Left eye: No discharge.     Extraocular Movements: Extraocular movements intact.     Conjunctiva/sclera: Conjunctivae normal.  Pulmonary:     Effort: Pulmonary effort is normal. No respiratory distress.  Skin:    General: Skin is warm and dry.  Neurological:     Mental Status: He is alert and oriented to person, place, and time.  Psychiatric:        Mood and Affect: Mood normal.        Behavior: Behavior normal.      No results found for any visits on 07/04/23.    The ASCVD Risk score (Arnett DK, et al., 2019) failed to calculate for the following reasons:   The 2019 ASCVD risk score is only valid for ages 54 to 25   Risk score cannot be calculated because patient has a medical history suggesting prior/existing ASCVD    Assessment & Plan:   Prediabetes  Microhematuria    Return Return as previously directed.  Advised urology referral for evaluation of hematuria.  He would like to hold off for now.  Will let me know if he changes his mind.  Expressed concern about possible bladder tumor.  For  prediabetes, asked him to moderate simple carbohydrate intake such as sweets and to discontinue sugary drinks.  Information was given on prediabetes  Mliss Sax, MD

## 2023-09-04 ENCOUNTER — Encounter: Payer: Self-pay | Admitting: Family Medicine

## 2023-09-04 ENCOUNTER — Ambulatory Visit (INDEPENDENT_AMBULATORY_CARE_PROVIDER_SITE_OTHER): Payer: Medicare Other | Admitting: Family Medicine

## 2023-09-04 VITALS — BP 124/64 | HR 53 | Temp 97.9°F | Ht 66.0 in | Wt 164.4 lb

## 2023-09-04 DIAGNOSIS — R7303 Prediabetes: Secondary | ICD-10-CM | POA: Diagnosis not present

## 2023-09-04 DIAGNOSIS — R3129 Other microscopic hematuria: Secondary | ICD-10-CM | POA: Diagnosis not present

## 2023-09-04 DIAGNOSIS — I10 Essential (primary) hypertension: Secondary | ICD-10-CM

## 2023-09-04 NOTE — Progress Notes (Signed)
 Established Patient Office Visit   Subjective:  Patient ID: Jose Vaughn, male    DOB: 1941-01-01  Age: 83 y.o. MRN: [id number]  Chief Complaint  Patient presents with   Medical Management of Chronic Issues    2 month follow up. Pt states no concerns.     HPI Encounter Diagnoses  Name Primary?   Prediabetes Yes   Microhematuria    Essential hypertension    Follow-up of above.  Blood pressure well-controlled with amlodipine and metoprolol.  Has never seen blood in his urine.  Energy levels have not been good.  He does not exercise regularly but feels as though he could   Review of Systems  Constitutional: Negative.   HENT: Negative.    Eyes:  Negative for blurred vision, discharge and redness.  Respiratory: Negative.    Cardiovascular: Negative.   Gastrointestinal:  Negative for abdominal pain.  Genitourinary: Negative.   Musculoskeletal: Negative.  Negative for myalgias.  Skin:  Negative for rash.  Neurological:  Negative for tingling, loss of consciousness and weakness.  Endo/Heme/Allergies:  Negative for polydipsia.     Current Outpatient Medications:    amLODipine (NORVASC) 5 MG tablet, TAKE 1 TABLET(5 MG) BY MOUTH DAILY, Disp: 90 tablet, Rfl: 3   aspirin EC 81 MG tablet, Take 81 mg by mouth daily., Disp: , Rfl:    atorvastatin (LIPITOR) 80 MG tablet, Take 1 tablet (80 mg total) by mouth daily., Disp: 90 tablet, Rfl: 3   metoprolol succinate (TOPROL-XL) 25 MG 24 hr tablet, TAKE 1 TABLET(25 MG) BY MOUTH DAILY, Disp: 90 tablet, Rfl: 3   Objective:     BP 124/64   Pulse (!) 53   Temp 97.9 F (36.6 C)   Ht 5\' 6"  (1.676 m)   Wt 164 lb 6.4 oz (74.6 kg)   SpO2 98%   BMI 26.53 kg/m    Physical Exam Constitutional:      General: He is not in acute distress.    Appearance: Normal appearance. He is not ill-appearing, toxic-appearing or diaphoretic.  HENT:     Head: Normocephalic and atraumatic.     Right Ear: External ear normal.     Left Ear: External ear  normal.  Eyes:     General: No scleral icterus.       Right eye: No discharge.        Left eye: No discharge.     Extraocular Movements: Extraocular movements intact.     Conjunctiva/sclera: Conjunctivae normal.  Pulmonary:     Effort: Pulmonary effort is normal. No respiratory distress.  Skin:    General: Skin is warm and dry.  Neurological:     Mental Status: He is alert and oriented to person, place, and time.  Psychiatric:        Mood and Affect: Mood normal.        Behavior: Behavior normal.      No results found for any visits on 09/04/23.    The ASCVD Risk score (Arnett DK, et al., 2019) failed to calculate for the following reasons:   The 2019 ASCVD risk score is only valid for ages 44 to 82   Risk score cannot be calculated because patient has a medical history suggesting prior/existing ASCVD    Assessment & Plan:   Prediabetes -     Basic metabolic panel -     Hemoglobin A1c  Microhematuria -     Urinalysis, Routine w reflex microscopic  Essential hypertension -  Basic metabolic panel    Return in about 6 months (around 03/03/2024).  Recheck urine at A1c.  Encouraged regular exercise by walking for 30 minutes daily.  Follow-up in 3 to 6 months pending results of labs today.  Mliss Sax, MD

## 2023-09-19 ENCOUNTER — Emergency Department (HOSPITAL_BASED_OUTPATIENT_CLINIC_OR_DEPARTMENT_OTHER)
Admission: EM | Admit: 2023-09-19 | Discharge: 2023-09-19 | Disposition: A | Discharge status: Home / Self Care | Attending: Emergency Medicine | Admitting: Emergency Medicine

## 2023-09-19 ENCOUNTER — Other Ambulatory Visit (HOSPITAL_BASED_OUTPATIENT_CLINIC_OR_DEPARTMENT_OTHER): Payer: Self-pay

## 2023-09-19 ENCOUNTER — Emergency Department (HOSPITAL_BASED_OUTPATIENT_CLINIC_OR_DEPARTMENT_OTHER)

## 2023-09-19 ENCOUNTER — Other Ambulatory Visit: Payer: Self-pay

## 2023-09-19 DIAGNOSIS — I10 Essential (primary) hypertension: Secondary | ICD-10-CM | POA: Diagnosis not present

## 2023-09-19 DIAGNOSIS — M25571 Pain in right ankle and joints of right foot: Secondary | ICD-10-CM | POA: Diagnosis not present

## 2023-09-19 DIAGNOSIS — M19071 Primary osteoarthritis, right ankle and foot: Secondary | ICD-10-CM | POA: Diagnosis not present

## 2023-09-19 DIAGNOSIS — Z8673 Personal history of transient ischemic attack (TIA), and cerebral infarction without residual deficits: Secondary | ICD-10-CM | POA: Insufficient documentation

## 2023-09-19 DIAGNOSIS — Z7982 Long term (current) use of aspirin: Secondary | ICD-10-CM | POA: Diagnosis not present

## 2023-09-19 DIAGNOSIS — Z79899 Other long term (current) drug therapy: Secondary | ICD-10-CM | POA: Diagnosis not present

## 2023-09-19 DIAGNOSIS — M79671 Pain in right foot: Secondary | ICD-10-CM | POA: Diagnosis not present

## 2023-09-19 DIAGNOSIS — M7731 Calcaneal spur, right foot: Secondary | ICD-10-CM | POA: Diagnosis not present

## 2023-09-19 MED ORDER — CEPHALEXIN 500 MG PO CAPS
500.0000 mg | ORAL_CAPSULE | Freq: Four times a day (QID) | ORAL | 0 refills | Status: AC
  Filled 2023-09-19: qty 20, 5d supply, fill #0

## 2023-09-19 NOTE — ED Provider Notes (Signed)
 [place] [facility] Provider Note   CSN: [id number] Arrival date & time: 09/19/23  0957     History  Chief Complaint  Patient presents with   Foot Pain    Jose Vaughn is a 83 y.o. male.  With a history of hypertension, previous stroke, hyperlipidemia presenting to the ED for evaluation of right foot pain.  He states he was wearing a tight shoe 7 days ago and feels like the shoe rubs his pinky toe.  Pain has worsened since then.  It involves the whole foot now.  No ankle pain.  No fevers or chills.  No nausea or vomiting.   Foot Pain       Home Medications Prior to Admission medications   Medication Sig Start Date End Date Taking? Authorizing Provider  cephALEXin (KEFLEX) 500 MG capsule Take 1 capsule (500 mg total) by mouth 4 (four) times daily. 09/19/23  Yes Aislee Landgren, Edsel Petrin, PA-C  amLODipine (NORVASC) 5 MG tablet TAKE 1 TABLET(5 MG) BY MOUTH DAILY 10/10/22   Mliss Sax, MD  aspirin EC 81 MG tablet Take 81 mg by mouth daily.    [provider]  atorvastatin (LIPITOR) 80 MG tablet Take 1 tablet (80 mg total) by mouth daily. 06/16/23   Mliss Sax, MD  metoprolol succinate (TOPROL-XL) 25 MG 24 hr tablet TAKE 1 TABLET(25 MG) BY MOUTH DAILY 10/10/22   Nche, Bonna Gains, NP      Allergies    Topiramate    Review of Systems   Review of Systems  Musculoskeletal:  Positive for arthralgias.  All other systems reviewed and are negative.   Physical Exam Updated Vital Signs BP (!) 151/113 (BP Location: Left Arm)   Pulse 65   Temp 98 F (36.7 C)   Resp 16   SpO2 100%  Physical Exam Vitals and nursing note reviewed.  Constitutional:      General: He is not in acute distress.    Appearance: Normal appearance. He is normal weight. He is not ill-appearing.  HENT:     Head: Normocephalic and atraumatic.  Pulmonary:     Effort: Pulmonary effort is normal. No respiratory distress.  Abdominal:     General:  Abdomen is flat.  Musculoskeletal:        General: Normal range of motion.     Cervical back: Neck supple.       Feet:  Feet:     Comments: Mild erythema to the lateral aspect of the fifth MTP joint although patient was wearing tight fitting shoes just prior to my assessment.  No warmth.  No discharge.  No lesions.  Moderate tenderness to palpation.  Compartments are soft.  DP pulse 2+. Skin:    General: Skin is warm and dry.  Neurological:     Mental Status: He is alert and oriented to person, place, and time.  Psychiatric:        Mood and Affect: Mood normal.        Behavior: Behavior normal.     ED Results / Procedures / Treatments   Labs (all labs ordered are listed, but only abnormal results are displayed) Labs Reviewed - No data to display  EKG None  Radiology DG Foot Complete Right Result Date: 09/19/2023 CLINICAL DATA:  Pain. Pain at the fourth and fifth metatarsal areas. No known injury. EXAM: RIGHT FOOT COMPLETE - 3+ VIEW COMPARISON:  None Available. FINDINGS: Negative for a fracture or dislocation. Degenerative changes along  the dorsal aspect of the midfoot. Prominent plantar calcaneal spur. There may be a small amount of soft tissue ossifications near the fifth metatarsal head. IMPRESSION: 1. No acute bone abnormality to the right foot. 2. Degenerative changes along the dorsal aspect of the midfoot. 3. Plantar calcaneal spur. Electronically Signed   By: Richarda Overlie M.D.   On: 09/19/2023 12:41    Procedures Procedures    Medications Ordered in ED Medications - No data to display  ED Course/ Medical Decision Making/ A&P                                 Medical Decision Making Amount and/or Complexity of Data Reviewed Radiology: ordered.  This patient presents to the ED for concern of right foot pain, this involves an extensive number of treatment options, and is a complaint that carries with it a high risk of complications and morbidity.  The differential  diagnosis includes fracture, strain, sprain, cellulitis, osteomyelitis, OA, RA, septic arthritis  My initial workup includes imaging  Additional history obtained from: Nursing notes from this visit. Wife at bedside  I ordered imaging studies including x-ray right foot I independently visualized and interpreted imaging which showed no acute osseous abnormality I agree with the radiologist interpretation  Afebrile, hemodynamically stable.  83 year old male presenting to the ED for evaluation of right foot pain.  Has been present for 1 week.  He believes it is due to wearing tight fitting shoes.  He is ambulatory.  On exam, there is a focal area of erythema to the lateral aspect of the fifth MTP joint.  Unclear if this is infectious in nature versus a minor pressure wound due to tight fitting shoes.  Will treat for cellulitis as his x-ray was negative.  He was given contact information for orthopedics and encouraged to follow-up.  He was given a postop shoe for comfort.  He was given return precautions.  Stable at discharge.  At this time there does not appear to be any evidence of an acute emergency medical condition and the patient appears stable for discharge with appropriate outpatient follow up. Diagnosis was discussed with patient who verbalizes understanding of care plan and is agreeable to discharge. I have discussed return precautions with patient who verbalizes understanding. Patient encouraged to follow-up with their PCP within 1 week. All questions answered.  Patient's case discussed with Dr. Theresia Lo who agrees with plan to discharge with follow-up.   Note: Portions of this report may have been transcribed using voice recognition software. Every effort was made to ensure accuracy; however, inadvertent computerized transcription errors may still be present.        Final Clinical Impression(s) / ED Diagnoses Final diagnoses:  Right foot pain    Rx / DC Orders ED Discharge  Orders          Ordered    cephALEXin (KEFLEX) 500 MG capsule  4 times daily        09/19/23 1252              Michelle Piper, PA-C 09/19/23 1255    Elayne Snare K, DO 09/19/23 1453

## 2023-09-19 NOTE — ED Triage Notes (Signed)
 Pt states that he was wearing a work boot last Friday and he began having irritation to the outer aspect of his R foot near his pinky toe. Pt denies trauma. No hx of DM. Pt states that he also has been having paresthesias since then to his R foot. Ambulatory with a limp to triage.

## 2023-09-19 NOTE — Discharge Instructions (Addendum)
 You have been seen today for your complaint of right foot pain. Your imaging was negative for any fractures or abnormalities of the bone. Your discharge medications include Keflex. This is an antibiotic. You should take it as prescribed. You should take it for the entire duration of the prescription. This may cause an upset stomach. This is normal. You may take this with food. You may also eat yogurt to prevent diarrhea. Home care instructions are as follows:  Wear the postop shoe Follow up with: Dr. Hulda Humphrey.  He is an orthopedic surgeon.  Call to schedule follow-up appointment Please seek immediate medical care if you develop any of the following symptoms: Your foot or toes turn white or blue. You have warmth and redness along your foot. At this time there does not appear to be the presence of an emergent medical condition, however there is always the potential for conditions to change. Please read and follow the below instructions.  Do not take your medicine if  develop an itchy rash, swelling in your mouth or lips, or difficulty breathing; call 911 and seek immediate emergency medical attention if this occurs.  You may review your lab tests and imaging results in their entirety on your MyChart account.  Please discuss all results of fully with your primary care provider and other specialist at your follow-up visit.  Note: Portions of this text may have been transcribed using voice recognition software. Every effort was made to ensure accuracy; however, inadvertent computerized transcription errors may still be present.

## 2023-09-26 DIAGNOSIS — M79671 Pain in right foot: Secondary | ICD-10-CM | POA: Diagnosis not present

## 2023-09-29 DIAGNOSIS — M79671 Pain in right foot: Secondary | ICD-10-CM | POA: Diagnosis not present

## 2023-10-06 ENCOUNTER — Ambulatory Visit: Payer: Medicare Other

## 2023-10-10 DIAGNOSIS — M79671 Pain in right foot: Secondary | ICD-10-CM | POA: Diagnosis not present

## 2023-12-30 ENCOUNTER — Other Ambulatory Visit: Payer: Self-pay | Admitting: Family Medicine

## 2023-12-30 ENCOUNTER — Other Ambulatory Visit: Payer: Self-pay | Admitting: Nurse Practitioner

## 2023-12-30 DIAGNOSIS — I10 Essential (primary) hypertension: Secondary | ICD-10-CM

## 2024-01-15 DIAGNOSIS — H35033 Hypertensive retinopathy, bilateral: Secondary | ICD-10-CM | POA: Diagnosis not present

## 2024-01-15 DIAGNOSIS — Z961 Presence of intraocular lens: Secondary | ICD-10-CM | POA: Diagnosis not present

## 2024-01-15 DIAGNOSIS — H35373 Puckering of macula, bilateral: Secondary | ICD-10-CM | POA: Diagnosis not present

## 2024-01-15 DIAGNOSIS — H53453 Other localized visual field defect, bilateral: Secondary | ICD-10-CM | POA: Diagnosis not present

## 2024-01-28 ENCOUNTER — Other Ambulatory Visit: Payer: Self-pay | Admitting: Nurse Practitioner

## 2024-01-28 ENCOUNTER — Other Ambulatory Visit: Payer: Self-pay | Admitting: Family Medicine

## 2024-01-28 DIAGNOSIS — I10 Essential (primary) hypertension: Secondary | ICD-10-CM

## 2024-05-06 NOTE — Progress Notes (Unsigned)
   05/06/2024  Patient ID: Jose Vaughn, male   DOB: 07-26-1940, 83 y.o.   MRN: [id number]  This patient is appearing on a report for being at risk of failing the adherence measure for cholesterol (statin) medications this calendar year.   Medication: Atorvastatin  80 mg tablets Last fill date: 04/25/24 for 90 day supply  Insurance report was not up to date. No action needed at this time.   Shekela Goodridge C. [facility] PharmD Candidate Class of [phone]

## 2024-07-24 ENCOUNTER — Other Ambulatory Visit: Payer: Self-pay | Admitting: Family Medicine

## 2024-07-24 DIAGNOSIS — E7800 Pure hypercholesterolemia, unspecified: Secondary | ICD-10-CM

## 2024-08-12 ENCOUNTER — Ambulatory Visit: Admitting: Family Medicine

## 2024-09-23 ENCOUNTER — Ambulatory Visit: Admitting: Family Medicine
